# Patient Record
Sex: Male | Born: 1955
Health system: Southern US, Community
[De-identification: ages and names within clinical notes are randomized; demographics above are authoritative.]

## PROBLEM LIST (undated history)

## (undated) DIAGNOSIS — K219 Gastro-esophageal reflux disease without esophagitis: Secondary | ICD-10-CM

## (undated) DIAGNOSIS — B019 Varicella without complication: Secondary | ICD-10-CM

## (undated) DIAGNOSIS — I10 Essential (primary) hypertension: Secondary | ICD-10-CM

## (undated) DIAGNOSIS — Z803 Family history of malignant neoplasm of breast: Secondary | ICD-10-CM

## (undated) HISTORY — DX: Family history of malignant neoplasm of breast: Z80.3

## (undated) HISTORY — DX: Essential (primary) hypertension: I10

## (undated) HISTORY — PX: TONSILLECTOMY: SUR1361

## (undated) HISTORY — DX: Varicella without complication: B01.9

## (undated) HISTORY — DX: Gastro-esophageal reflux disease without esophagitis: K21.9

---
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
## 1978-03-01 HISTORY — PX: MANDIBLE SURGERY: SHX707

## 1978-03-01 HISTORY — PX: REPAIR KNEE LIGAMENT: SUR1188

## 2005-09-14 ENCOUNTER — Ambulatory Visit: Payer: Self-pay | Admitting: Family Medicine

## 2005-09-15 ENCOUNTER — Ambulatory Visit: Payer: Self-pay | Admitting: Family Medicine

## 2005-09-22 ENCOUNTER — Ambulatory Visit: Payer: Self-pay

## 2006-10-27 ENCOUNTER — Encounter: Payer: Self-pay | Admitting: Family Medicine

## 2008-12-30 ENCOUNTER — Encounter (INDEPENDENT_AMBULATORY_CARE_PROVIDER_SITE_OTHER): Payer: Self-pay | Admitting: *Deleted

## 2011-04-06 ENCOUNTER — Ambulatory Visit (INDEPENDENT_AMBULATORY_CARE_PROVIDER_SITE_OTHER): Payer: BC Managed Care – PPO | Admitting: Family Medicine

## 2011-04-06 ENCOUNTER — Encounter: Payer: Self-pay | Admitting: Family Medicine

## 2011-04-06 VITALS — BP 124/82 | HR 72 | Temp 98.2°F | Resp 12 | Ht 69.0 in | Wt 218.0 lb

## 2011-04-06 DIAGNOSIS — Z0000 Encounter for general adult medical examination without abnormal findings: Secondary | ICD-10-CM

## 2011-04-06 LAB — POCT URINALYSIS DIPSTICK
Bilirubin, UA: NEGATIVE
Leukocytes, UA: NEGATIVE
Nitrite, UA: NEGATIVE
Protein, UA: NEGATIVE
pH, UA: 6

## 2011-04-06 LAB — LIPID PANEL
Cholesterol: 156 mg/dL (ref 0–200)
HDL: 30.4 mg/dL — ABNORMAL LOW (ref >39.00)
Triglycerides: 170 mg/dL — ABNORMAL HIGH (ref 0.0–149.0)
VLDL: 34 mg/dL (ref 0.0–40.0)

## 2011-04-06 LAB — HEPATIC FUNCTION PANEL
ALT: 28 U/L (ref 0–53)
AST: 30 U/L (ref 0–37)
Albumin: 4.3 g/dL (ref 3.5–5.2)

## 2011-04-06 LAB — CBC WITH DIFFERENTIAL/PLATELET
Eosinophils Absolute: 0.1 10*3/uL (ref 0.0–0.7)
Eosinophils Relative: 1.4 % (ref 0.0–5.0)
HCT: 48.4 % (ref 39.0–52.0)
Lymphs Abs: 2.6 10*3/uL (ref 0.7–4.0)
MCHC: 34 g/dL (ref 30.0–36.0)
MCV: 95.4 fl (ref 78.0–100.0)
Monocytes Absolute: 0.7 10*3/uL (ref 0.1–1.0)
Platelets: 312 10*3/uL (ref 150.0–400.0)
WBC: 7.7 10*3/uL (ref 4.5–10.5)

## 2011-04-06 LAB — TSH: TSH: 2.49 u[IU]/mL (ref 0.35–5.50)

## 2011-04-06 LAB — BASIC METABOLIC PANEL
Calcium: 9.8 mg/dL (ref 8.4–10.5)
GFR: 81.23 mL/min (ref >60.00)
Potassium: 4.5 mEq/L (ref 3.5–5.1)
Sodium: 142 mEq/L (ref 135–145)

## 2011-04-06 LAB — PSA: PSA: 0.73 ng/mL (ref 0.10–4.00)

## 2011-04-06 NOTE — Progress Notes (Signed)
Subjective:    Patient ID: Miguel [name], male    DOB: 1956/01/09, 56 y.o.   MRN: [id number]  HPI  New patient to establish care and for complete physical examination. Past medical history reviewed. Mild GERD symptoms controlled with over-the-counter medications. No prescription medications. Surgical history as outlined elsewhere.  Family history significant for mother breast cancer. Father had high blood pressure elevated and cholesterol. No premature CAD. Sister with type 2 diabetes.  Patient is married with 2 teenage children. No history of smoking. No alcohol use. 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  No history of screening colonoscopy. What sounds like a flexible sigmoidoscopy several years ago. Last tetanus 2006. No recent lab work  Recent problems with right shoulder pain. Daily pain. Worse at night. No weakness. No injury. Radiation toward deltoid. No neck pain. Worse with abduction and internal patient  Past Medical History  Diagnosis Date  . GERD (gastroesophageal reflux disease)   . Chicken pox    Past Surgical History  Procedure Date  . Tonsillectomy   . Nose repair 1970    deviated septum  . Repair knee ligament 1980     accident  . Mandible surgery 1980  . Repair ankle ligament 1975    reports that he has never smoked. He does not have any smokeless tobacco history on file. His alcohol and drug histories not on file. family history includes Cancer in his mother; Diabetes in his sister; Hyperlipidemia in his father and mother; and Hypertension in his father and mother. No Known Allergies      Review of Systems  Constitutional: Negative for fever, activity change, appetite change and fatigue.  HENT: Negative for ear pain, congestion and trouble swallowing.   Eyes: Negative for pain and visual disturbance.  Respiratory: Negative for cough, shortness of breath and wheezing.   Cardiovascular: Negative for chest pain and  palpitations.  Gastrointestinal: Negative for nausea, vomiting, abdominal pain, diarrhea, constipation, blood in stool, abdominal distention and rectal pain.  Genitourinary: Negative for dysuria, hematuria and testicular pain.  Musculoskeletal: Positive for arthralgias (right shoulder). Negative for joint swelling.  Skin: Negative for rash.  Neurological: Negative for dizziness, syncope and headaches.  Hematological: Negative for adenopathy.  Psychiatric/Behavioral: Negative for confusion and dysphoric mood.       Objective:   Physical Exam  Constitutional: He is oriented to person, place, and time. He appears well-developed and well-nourished. No distress.  HENT:  Head: Normocephalic and atraumatic.  Right Ear: External ear normal.  Left Ear: External ear normal.  Mouth/Throat: Oropharynx is clear and moist.  Eyes: Conjunctivae and EOM are normal. Pupils are equal, round, and reactive to light.  Neck: Normal range of motion. Neck supple. No thyromegaly present.  Cardiovascular: Normal rate, regular rhythm and normal heart sounds.   No murmur heard. Pulmonary/Chest: No respiratory distress. He has no wheezes. He has no rales.  Abdominal: Soft. Bowel sounds are normal. He exhibits no distension and no mass. There is no tenderness. There is no rebound and no guarding.  Genitourinary: Rectum normal and prostate normal.  Musculoskeletal: He exhibits no edema.       Full range of motion right shoulder. No a.c. joint tenderness. Pain with abduction and internal rotation. No definite weakness.  Lymphadenopathy:    He has no cervical adenopathy.  Neurological: He is alert and oriented to person, place, and time. He displays normal reflexes. No cranial nerve deficit.  Skin: No rash noted.  Psychiatric: He has a  normal mood and affect.          Assessment & Plan:  Complete physical. Patient declines flu vaccine. Schedule screening colonoscopy. Schedule screening lab work. Needs to work  on weight loss and establishing regular exercise. Right shoulder pain.  Suspect rotator cuff tendonitis.  Discussed possible steroid injection and he prefers to observe for now.  ROM to avoid adhesive capsulitis.

## 2011-04-06 NOTE — Patient Instructions (Signed)
Work on establishing regular exercise and weight loss. We will call you regarding appointment for colonoscopy.

## 2011-04-07 NOTE — Progress Notes (Signed)
Quick Note:  Pt informed, labs mailed to home ______

## 2011-05-03 ENCOUNTER — Encounter: Payer: Self-pay | Admitting: Gastroenterology

## 2011-05-27 ENCOUNTER — Ambulatory Visit (AMBULATORY_SURGERY_CENTER): Payer: BC Managed Care – PPO | Admitting: *Deleted

## 2011-05-27 VITALS — Ht 69.0 in | Wt 212.5 lb

## 2011-05-27 DIAGNOSIS — Z1211 Encounter for screening for malignant neoplasm of colon: Secondary | ICD-10-CM

## 2011-05-27 MED ORDER — PEG-KCL-NACL-NASULF-NA ASC-C 100 G PO SOLR: ORAL | Status: DC

## 2011-06-09 ENCOUNTER — Encounter: Payer: Self-pay | Admitting: Gastroenterology

## 2011-06-09 ENCOUNTER — Ambulatory Visit (AMBULATORY_SURGERY_CENTER): Payer: BC Managed Care – PPO | Admitting: Gastroenterology

## 2011-06-09 VITALS — BP 151/100 | HR 81 | Temp 98.4°F | Resp 24 | Ht 69.0 in | Wt 212.0 lb

## 2011-06-09 DIAGNOSIS — Z1211 Encounter for screening for malignant neoplasm of colon: Secondary | ICD-10-CM

## 2011-06-09 DIAGNOSIS — K5730 Diverticulosis of large intestine without perforation or abscess without bleeding: Secondary | ICD-10-CM

## 2011-06-09 MED ORDER — SODIUM CHLORIDE 0.9 % IV SOLN: 500.0000 mL | INTRAVENOUS | Status: DC

## 2011-06-09 NOTE — Patient Instructions (Signed)
YOU HADF AN ENDOSCOPIC PROCEDURE TODAY AT THE [facility]: Refer to the procedure report that was given to you for any specific questions about what was found during the examination.  If the procedure report does not answer your questions, please call your gastroenterologist to clarify.  If you requested that your care partner not be given the details of your procedure findings, then the procedure report has been included in a sealed envelope for you to review at your convenience later.  YOU SHOULD EXPECT: Some feelings of bloating in the abdomen. Passage of more gas than usual.  Walking can help get rid of the air that was put into your GI tract during the procedure and reduce the bloating. If you had a lower endoscopy (such as a colonoscopy or flexible sigmoidoscopy) you may notice spotting of blood in your stool or on the toilet paper. If you underwent a bowel prep for your procedure, then you may not have a normal bowel movement for a few days.  DIET: Your first meal following the procedure should be a light meal and then it is ok to progress to your normal diet.  A half-sandwich or bowl of soup is an example of a good first meal.  Heavy or fried foods are harder to digest and may make you feel nauseous or bloated.  Likewise meals heavy in dairy and vegetables can cause extra gas to form and this can also increase the bloating.  Drink plenty of fluids but you should avoid alcoholic beverages for 24 hours.  ACTIVITY: Your care partner should take you home directly after the procedure.  You should plan to take it easy, moving slowly for the rest of the day.  You can resume normal activity the day after the procedure however you should NOT DRIVE or use heavy machinery for 24 hours (because of the sedation medicines used during the test).    SYMPTOMS TO REPORT IMMEDIATELY: A gastroenterologist can be reached at any hour.  During normal business hours, 8:30 AM to 5:00 PM Monday through Friday,  call [phone].  After hours and on weekends, please call the GI answering service at [phone] who will take a message and have the physician on call contact you.   Following lower endoscopy (colonoscopy or flexible sigmoidoscopy):  Excessive amounts of blood in the stool  Significant tenderness or worsening of abdominal pains  Swelling of the abdomen that is new, acute  Fever of 100F or higher    FOLLOW UP: If any biopsies were taken you will be contacted by phone or by letter within the next 1-3 weeks.  Call your gastroenterologist if you have not heard about the biopsies in 3 weeks.  Our staff will call the home number listed on your records the next business day following your procedure to check on you and address any questions or concerns that you may have at that time regarding the information given to you following your procedure. This is a courtesy call and so if there is no answer at the home number and we have not heard from you through the emergency physician on call, we will assume that you have returned to your regular daily activities without incident.  SIGNATURES/CONFIDENTIALITY: You and/or your care partner have signed paperwork which will be entered into your electronic medical record.  These signatures attest to the fact that that the information above on your After Visit Summary has been reviewed and is understood.  Full responsibility of the confidentiality  of this discharge information lies with you and/or your care-partner.     Information on diverticulosis & high fiber diet given to you today.

## 2011-06-09 NOTE — Op Note (Signed)
[facility] [address] [place], [place]  [address]  COLONOSCOPY PROCEDURE REPORT  PATIENT:  [name], Miguel  MR#:  [id number] BIRTHDATE:  12-15-1955, 56 yrs. old  GENDER:  male ENDOSCOPIST:  Vania Rea. Jarold Motto, MD, [facility] REF. BY:  Evelena Peat, M.D. PROCEDURE DATE:  06/09/2011 PROCEDURE:  Average-risk screening colonoscopy G0121 ASA CLASS:  Class I INDICATIONS:  Routine Risk Screening MEDICATIONS:   propofol (Diprivan) 150 mg IV  DESCRIPTION OF PROCEDURE:   After the risks and benefits and of the procedure were explained, informed consent was obtained. Digital rectal exam was performed and revealed no abnormalities. The LB 180AL [id number] endoscope was introduced through the anus and advanced to the cecum, which was identified by both the appendix and ileocecal valve.  The quality of the prep was excellent, using MoviPrep.  The instrument was then slowly withdrawn as the colon was fully examined. <<PROCEDUREIMAGES>>  FINDINGS:  Mild diverticulosis was found in the right colon.  No polyps or cancers were seen.  This was otherwise a normal examination of the colon.   Retroflexed views in the rectum revealed no abnormalities.    The scope was then withdrawn from the patient and the procedure completed.  COMPLICATIONS:  None ENDOSCOPIC IMPRESSION: 1) Mild diverticulosis in the right colon 2) No polyps or cancers 3) Otherwise normal examination RECOMMENDATIONS: 1) Continue current colorectal screening recommendations for "routine risk" patients with a repeat colonoscopy in 10 years.  REPEAT EXAM:  No  ______________________________ Vania Rea. Jarold Motto, MD, [facility]  CC:  n. eSIGNED:   Vania Rea. Dalaya Suppa at 06/09/2011 08:48 AM  Orlene Plum, [id number]

## 2011-06-09 NOTE — Progress Notes (Signed)
Patient did not experience any of the following events: a burn prior to discharge; a fall within the facility; wrong site/side/patient/procedure/implant event; or a hospital transfer or hospital admission upon discharge from the facility. (G8907) Patient did not have preoperative order for IV antibiotic SSI prophylaxis. (G8918)  

## 2011-06-09 NOTE — Progress Notes (Signed)
The pt tolerated the colonoscopy very well. Maw   

## 2011-06-10 ENCOUNTER — Telehealth: Payer: Self-pay

## 2011-06-10 NOTE — Telephone Encounter (Signed)
  Follow up Call-  Call back number 06/09/2011  Post procedure Call Back phone  # [phone]  Permission to leave phone message Yes     Patient questions:  Do you have a fever, pain , or abdominal swelling? no Pain Score  0 *  Have you tolerated food without any problems? yes  Have you been able to return to your normal activities? yes  Do you have any questions about your discharge instructions: Diet   no Medications  no Follow up visit  no  Do you have questions or concerns about your Care? no  Actions: * If pain score is 4 or above: No action needed, pain <4.  Per the pt "I did great". Maw

## 2012-04-07 ENCOUNTER — Other Ambulatory Visit (INDEPENDENT_AMBULATORY_CARE_PROVIDER_SITE_OTHER): Payer: BC Managed Care – PPO

## 2012-04-07 DIAGNOSIS — Z0000 Encounter for general adult medical examination without abnormal findings: Secondary | ICD-10-CM

## 2012-04-07 LAB — BASIC METABOLIC PANEL
BUN: 11 mg/dL (ref 6–23)
Chloride: 104 mEq/L (ref 96–112)
Potassium: 4.5 mEq/L (ref 3.5–5.1)

## 2012-04-07 LAB — POCT URINALYSIS DIPSTICK
Spec Grav, UA: 1.025
Urobilinogen, UA: 0.2
pH, UA: 6

## 2012-04-07 LAB — CBC WITH DIFFERENTIAL/PLATELET
Basophils Absolute: 0.1 10*3/uL (ref 0.0–0.1)
Basophils Relative: 1.4 % (ref 0.0–3.0)
Eosinophils Absolute: 0.1 10*3/uL (ref 0.0–0.7)
Lymphocytes Relative: 35.7 % (ref 12.0–46.0)
MCHC: 33.8 g/dL (ref 30.0–36.0)
Neutrophils Relative %: 50.7 % (ref 43.0–77.0)
RBC: 5 Mil/uL (ref 4.22–5.81)

## 2012-04-07 LAB — LIPID PANEL
Cholesterol: 146 mg/dL (ref 0–200)
LDL Cholesterol: 91 mg/dL (ref 0–99)
Triglycerides: 111 mg/dL (ref 0.0–149.0)
VLDL: 22.2 mg/dL (ref 0.0–40.0)

## 2012-04-07 LAB — HEPATIC FUNCTION PANEL
AST: 26 U/L (ref 0–37)
Albumin: 4.2 g/dL (ref 3.5–5.2)
Alkaline Phosphatase: 74 U/L (ref 39–117)
Total Protein: 7.2 g/dL (ref 6.0–8.3)

## 2012-04-14 ENCOUNTER — Encounter: Payer: BC Managed Care – PPO | Admitting: Family Medicine

## 2012-04-26 ENCOUNTER — Ambulatory Visit (INDEPENDENT_AMBULATORY_CARE_PROVIDER_SITE_OTHER): Payer: BC Managed Care – PPO | Admitting: Family Medicine

## 2012-04-26 ENCOUNTER — Encounter: Payer: Self-pay | Admitting: Family Medicine

## 2012-04-26 VITALS — BP 138/78 | HR 72 | Temp 98.9°F | Resp 12 | Ht 69.0 in | Wt 206.0 lb

## 2012-04-26 DIAGNOSIS — R319 Hematuria, unspecified: Secondary | ICD-10-CM

## 2012-04-26 DIAGNOSIS — Z0000 Encounter for general adult medical examination without abnormal findings: Secondary | ICD-10-CM

## 2012-04-26 LAB — URINALYSIS, ROUTINE W REFLEX MICROSCOPIC
Ketones, ur: NEGATIVE
Leukocytes, UA: NEGATIVE
Nitrite: NEGATIVE
Specific Gravity, Urine: >=1.03 (ref 1.000–1.030)
Urobilinogen, UA: 0.2 (ref 0.0–1.0)

## 2012-04-26 LAB — POCT URINALYSIS DIPSTICK
Bilirubin, UA: NEGATIVE
Glucose, UA: NEGATIVE
Leukocytes, UA: NEGATIVE
Nitrite, UA: NEGATIVE
pH, UA: 6

## 2012-04-26 NOTE — Patient Instructions (Signed)
Try to establish more consistent exercise habits Continue weight control/loss efforts

## 2012-04-26 NOTE — Progress Notes (Signed)
  Subjective:    Patient ID: Miguel [name], male    DOB: 01-29-1956, 57 y.o.   MRN: [id number]  HPI Patient here for complete physical. He has no real chronic medical problems. He has lost about 12 pounds during the past year due to his efforts. He has done this through dietary change and also has job on his feet about 30 hours per week, so has more activity. Generally feels well. Tetanus up-to-date. Colonoscopy last spring normal other than mild diverticulosis. Nonsmoker  Past Medical History  Diagnosis Date  . GERD (gastroesophageal reflux disease)   . Chicken pox    Past Surgical History  Procedure Laterality Date  . Tonsillectomy    . Nose repair  1970    deviated septum  . Repair knee ligament  1980     accident  . Mandible surgery  1980  . Repair ankle ligament  1975    reports that he has never smoked. He has never used smokeless tobacco. He reports that he does not drink alcohol or use illicit drugs. family history includes Cancer in his mother; Diabetes in his sister; Heart disease in his sister; Hyperlipidemia in his father, mother, and sister; and Hypertension in his father and mother.  There is no history of Colon cancer and Stomach cancer. No Known Allergies    Review of Systems  Constitutional: Negative for fever, activity change, appetite change and fatigue.  HENT: Negative for ear pain, congestion and trouble swallowing.   Eyes: Negative for pain and visual disturbance.  Respiratory: Negative for cough, shortness of breath and wheezing.   Cardiovascular: Negative for chest pain and palpitations.  Gastrointestinal: Negative for nausea, vomiting, abdominal pain, diarrhea, constipation, blood in stool, abdominal distention and rectal pain.  Genitourinary: Negative for dysuria, hematuria and testicular pain.  Musculoskeletal: Negative for joint swelling and arthralgias.  Skin: Negative for rash.  Neurological: Negative for dizziness, syncope and headaches.   Hematological: Negative for adenopathy.  Psychiatric/Behavioral: Negative for confusion and dysphoric mood.       Objective:   Physical Exam  Constitutional: He is oriented to person, place, and time. He appears well-developed and well-nourished. No distress.  HENT:  Head: Normocephalic and atraumatic.  Right Ear: External ear normal.  Left Ear: External ear normal.  Mouth/Throat: Oropharynx is clear and moist.  Eyes: Conjunctivae and EOM are normal. Pupils are equal, round, and reactive to light.  Neck: Normal range of motion. Neck supple. No thyromegaly present.  Cardiovascular: Normal rate, regular rhythm and normal heart sounds.   No murmur heard. Pulmonary/Chest: No respiratory distress. He has no wheezes. He has no rales.  Abdominal: Soft. Bowel sounds are normal. He exhibits no distension and no mass. There is no tenderness. There is no rebound and no guarding.  Musculoskeletal: He exhibits no edema.  Lymphadenopathy:    He has no cervical adenopathy.  Neurological: He is alert and oriented to person, place, and time. He displays normal reflexes. No cranial nerve deficit.  Skin: No rash noted.  Psychiatric: He has a normal mood and affect.          Assessment & Plan:  Healthy 57 year old male. Lipids are slightly improved with weight loss efforts. He has trace blood on urine dipstick. Repeat urine today and if blood persists urine micro-. Encouraged more regular exercise habits

## 2012-04-27 NOTE — Progress Notes (Signed)
Quick Note:  Pt informed ______ 

## 2013-10-10 ENCOUNTER — Ambulatory Visit (INDEPENDENT_AMBULATORY_CARE_PROVIDER_SITE_OTHER): Payer: BC Managed Care – PPO | Admitting: Family Medicine

## 2013-10-10 ENCOUNTER — Encounter: Payer: Self-pay | Admitting: Family Medicine

## 2013-10-10 VITALS — BP 140/86 | HR 79 | Wt 205.0 lb

## 2013-10-10 DIAGNOSIS — S39012A Strain of muscle, fascia and tendon of lower back, initial encounter: Secondary | ICD-10-CM

## 2013-10-10 DIAGNOSIS — S335XXA Sprain of ligaments of lumbar spine, initial encounter: Secondary | ICD-10-CM

## 2013-10-10 NOTE — Progress Notes (Signed)
Pre visit review using our clinic review tool, if applicable. No additional management support is needed unless otherwise documented below in the visit note. 

## 2013-10-10 NOTE — Patient Instructions (Signed)

## 2013-10-10 NOTE — Progress Notes (Signed)
   Subjective:    Patient ID: Miguel [name], male    DOB: Mar 13, 1955, 58 y.o.   MRN: [id number]  Back Pain Pertinent negatives include no abdominal pain, chest pain, dysuria, fever, numbness or weakness.   Acute visit. Low back pain. Onset couple days ago after picking up some boxes and twisting. Location is mid lumbar area. No radiculopathy symptoms. No numbness or weakness. No loss of bladder or bowel control. He took some diclofenac along with Aleve which seemed to help. Also took some leftover Flexeril. He tried icing which helps. Pain is 8/10 prior to medication and after taking Aleve pain had almost resolved.  Past Medical History  Diagnosis Date  . GERD (gastroesophageal reflux disease)   . Chicken pox    Past Surgical History  Procedure Laterality Date  . Tonsillectomy    . Nose repair  1970    deviated septum  . Repair knee ligament  1980     accident  . Mandible surgery  1980  . Repair ankle ligament  1975    reports that he has never smoked. He has never used smokeless tobacco. He reports that he does not drink alcohol or use illicit drugs. family history includes Cancer in his mother; Diabetes in his sister; Heart disease in his sister; Hyperlipidemia in his father, mother, and sister; Hypertension in his father and mother. There is no history of Colon cancer or Stomach cancer. No Known Allergies    Review of Systems  Constitutional: Negative for fever, activity change and appetite change.  Respiratory: Negative for cough and shortness of breath.   Cardiovascular: Negative for chest pain and leg swelling.  Gastrointestinal: Negative for vomiting and abdominal pain.  Genitourinary: Negative for dysuria, hematuria and flank pain.  Musculoskeletal: Positive for back pain. Negative for joint swelling.  Neurological: Negative for weakness and numbness.       Objective:   Physical Exam  Constitutional: He appears well-developed and well-nourished. No distress.    Cardiovascular: Normal rate and regular rhythm.   Pulmonary/Chest: Effort normal and breath sounds normal. No respiratory distress. He has no wheezes. He has no rales.  Musculoskeletal:  Right leg raise are negative  Neurological:  No focal weakness. Deep tendon reflexes are symmetric knee and ankle bilaterally.          Assessment & Plan:  Lumbosacral back strain. Nonfocal exam. Continue Aleve but avoid concomitant use of diclofenac. Continue Flexeril as needed. Continue icing or heat for symptomatic relief. Stretches reviewed. Consider physical therapy if no further improvement in 2 weeks

## 2014-07-31 ENCOUNTER — Encounter: Payer: Self-pay | Admitting: Family Medicine

## 2014-07-31 ENCOUNTER — Ambulatory Visit (INDEPENDENT_AMBULATORY_CARE_PROVIDER_SITE_OTHER): Payer: BLUE CROSS/BLUE SHIELD | Admitting: Family Medicine

## 2014-07-31 VITALS — BP 154/90 | HR 84 | Temp 98.5°F | Wt 201.0 lb

## 2014-07-31 DIAGNOSIS — R030 Elevated blood-pressure reading, without diagnosis of hypertension: Secondary | ICD-10-CM | POA: Diagnosis not present

## 2014-07-31 DIAGNOSIS — R222 Localized swelling, mass and lump, trunk: Secondary | ICD-10-CM

## 2014-07-31 DIAGNOSIS — R1900 Intra-abdominal and pelvic swelling, mass and lump, unspecified site: Secondary | ICD-10-CM | POA: Diagnosis not present

## 2014-07-31 DIAGNOSIS — IMO0001 Reserved for inherently not codable concepts without codable children: Secondary | ICD-10-CM

## 2014-07-31 NOTE — Progress Notes (Signed)
Pre visit review using our clinic review tool, if applicable. No additional management support is needed unless otherwise documented below in the visit note. 

## 2014-07-31 NOTE — Patient Instructions (Signed)
DASH Eating Plan °DASH stands for "Dietary Approaches to Stop Hypertension." The DASH eating plan is a healthy eating plan that has been shown to reduce high blood pressure (hypertension). Additional health benefits may include reducing the risk of type 2 diabetes mellitus, heart disease, and stroke. The DASH eating plan may also help with weight loss. °WHAT DO I NEED TO KNOW ABOUT THE DASH EATING PLAN? °For the DASH eating plan, you will follow these general guidelines: °· Choose foods with a percent daily value for sodium of less than 5% (as listed on the food label). °· Use salt-free seasonings or herbs instead of table salt or sea salt. °· Check with your health care provider or pharmacist before using salt substitutes. °· Eat lower-sodium products, often labeled as "lower sodium" or "no salt added." °· Eat fresh foods. °· Eat more vegetables, fruits, and low-fat dairy products. °· Choose whole grains. Look for the word "whole" as the first word in the ingredient list. °· Choose fish and skinless chicken or turkey more often than red meat. Limit fish, poultry, and meat to 6 oz (170 g) each day. °· Limit sweets, desserts, sugars, and sugary drinks. °· Choose heart-healthy fats. °· Limit cheese to 1 oz (28 g) per day. °· Eat more home-cooked food and less restaurant, buffet, and fast food. °· Limit fried foods. °· Cook foods using methods other than frying. °· Limit canned vegetables. If you do use them, rinse them well to decrease the sodium. °· When eating at a restaurant, ask that your food be prepared with less salt, or no salt if possible. °WHAT FOODS CAN I EAT? °Seek help from a dietitian for individual calorie needs. °Grains °Whole grain or whole wheat bread. Brown rice. Whole grain or whole wheat pasta. Quinoa, bulgur, and whole grain cereals. Low-sodium cereals. Corn or whole wheat flour tortillas. Whole grain cornbread. Whole grain crackers. Low-sodium crackers. °Vegetables °Fresh or frozen vegetables  (raw, steamed, roasted, or grilled). Low-sodium or reduced-sodium tomato and vegetable juices. Low-sodium or reduced-sodium tomato sauce and paste. Low-sodium or reduced-sodium canned vegetables.  °Fruits °All fresh, canned (in natural juice), or frozen fruits. °Meat and Other Protein Products °Ground beef (85% or leaner), grass-fed beef, or beef trimmed of fat. Skinless chicken or turkey. Ground chicken or turkey. Pork trimmed of fat. All fish and seafood. Eggs. Dried beans, peas, or lentils. Unsalted nuts and seeds. Unsalted canned beans. °Dairy °Low-fat dairy products, such as skim or 1% milk, 2% or reduced-fat cheeses, low-fat ricotta or cottage cheese, or plain low-fat yogurt. Low-sodium or reduced-sodium cheeses. °Fats and Oils °Tub margarines without trans fats. Light or reduced-fat mayonnaise and salad dressings (reduced sodium). Avocado. Safflower, olive, or canola oils. Natural peanut or almond butter. °Other °Unsalted popcorn and pretzels. °The items listed above may not be a complete list of recommended foods or beverages. Contact your dietitian for more options. °WHAT FOODS ARE NOT RECOMMENDED? °Grains °White bread. White pasta. White rice. Refined cornbread. Bagels and croissants. Crackers that contain trans fat. °Vegetables °Creamed or fried vegetables. Vegetables in a cheese sauce. Regular canned vegetables. Regular canned tomato sauce and paste. Regular tomato and vegetable juices. °Fruits °Dried fruits. Canned fruit in light or heavy syrup. Fruit juice. °Meat and Other Protein Products °Fatty cuts of meat. Ribs, chicken wings, bacon, sausage, bologna, salami, chitterlings, fatback, hot dogs, bratwurst, and packaged luncheon meats. Salted nuts and seeds. Canned beans with salt. °Dairy °Whole or 2% milk, cream, half-and-half, and cream cheese. Whole-fat or sweetened yogurt. Full-fat   cheeses or blue cheese. Nondairy creamers and whipped toppings. Processed cheese, cheese spreads, or cheese  curds. °Condiments °Onion and garlic salt, seasoned salt, table salt, and sea salt. Canned and packaged gravies. Worcestershire sauce. Tartar sauce. Barbecue sauce. Teriyaki sauce. Soy sauce, including reduced sodium. Steak sauce. Fish sauce. Oyster sauce. Cocktail sauce. Horseradish. Ketchup and mustard. Meat flavorings and tenderizers. Bouillon cubes. Hot sauce. Tabasco sauce. Marinades. Taco seasonings. Relishes. °Fats and Oils °Butter, stick margarine, lard, shortening, ghee, and bacon fat. Coconut, palm kernel, or palm oils. Regular salad dressings. °Other °Pickles and olives. Salted popcorn and pretzels. °The items listed above may not be a complete list of foods and beverages to avoid. Contact your dietitian for more information. °WHERE CAN I FIND MORE INFORMATION? °National Heart, [employer], and [employer] Institute: www.nhlbi.nih.gov/health/health-topics/topics/dash/ °Document Released: 02/04/2011 Document Revised: 07/02/2013 Document Reviewed: 12/20/2012 °Exit[employer]® Patient Information ©2015 [employer]. This information is not intended to replace advice given to you by your health care provider. Make sure you discuss any questions you have with your health care provider. ° °

## 2014-07-31 NOTE — Progress Notes (Signed)
   Subjective:    Patient ID: Miguel [name], male    DOB: Aug 22, 1955, 59 y.o.   MRN: [id number]  HPI Here to discuss recent elevated blood pressure. He was at dentist office and had pre-procedure blood pressure 190/110. He has never been diagnosed with hypertension but positive family history and father and sister. No headaches. No chest pains. No dizziness. Does eat a lot of salt. No regular alcohol. No NSAID use.  No consistent aerobic exercise.  Recent small fatty lump right anterior abdominal wall. Nontender. Not growing rapidly in size. No recent appetite or weight changes.  He has not noted any adenopathy or night sweats.  Past Medical History  Diagnosis Date  . GERD (gastroesophageal reflux disease)   . Chicken pox    Past Surgical History  Procedure Laterality Date  . Tonsillectomy    . Nose repair  1970    deviated septum  . Repair knee ligament  1980     accident  . Mandible surgery  1980  . Repair ankle ligament  1975    reports that he has never smoked. He has never used smokeless tobacco. He reports that he does not drink alcohol or use illicit drugs. family history includes Cancer in his mother; Diabetes in his sister; Heart disease in his sister; Hyperlipidemia in his father, mother, and sister; Hypertension in his father and mother. There is no history of Colon cancer or Stomach cancer. No Known Allergies    Review of Systems  Constitutional: Negative for appetite change, fatigue and unexpected weight change.  Eyes: Negative for visual disturbance.  Respiratory: Negative for cough, chest tightness and shortness of breath.   Cardiovascular: Negative for chest pain, palpitations and leg swelling.  Gastrointestinal: Negative for abdominal pain.  Musculoskeletal: Negative for back pain.  Skin: Negative for rash.  Neurological: Negative for dizziness, syncope, weakness, light-headedness and headaches.  Hematological: Negative for adenopathy. Does not bruise/bleed  easily.       Objective:   Physical Exam  Constitutional: He appears well-developed and well-nourished. No distress.  Eyes: Pupils are equal, round, and reactive to light.  Neck: Normal range of motion. Neck supple. No thyromegaly present.  Cardiovascular: Normal rate, regular rhythm and normal heart sounds.   No murmur heard. Pulmonary/Chest: No respiratory distress. He has no wheezes. He has no rales.  Abdominal: Soft. Bowel sounds are normal. He exhibits no distension and no mass. There is no tenderness. There is no rebound and no guarding.  Right abdominal wall just lateral to umbilicus approximately 2 cm fatty nontender mass which is slightly mobile  Musculoskeletal: He exhibits no edema.  Skin: No rash noted.  Psychiatric: He has a normal mood and affect.          Assessment & Plan:  # 1 elevated blood pressure. Repeat reading by me 154/90. We discussed lifestyle modification. DASH diet given. Start regular aerobic exercise. Get home blood pressure cuff. Reassess blood pressure here 8 weeks and if not improved at that point initiate medication #2 probable lipoma right abdominal wall. Reassurance. Follow-up for any pain or rapid growth or other changes

## 2014-10-02 ENCOUNTER — Ambulatory Visit: Payer: BLUE CROSS/BLUE SHIELD | Admitting: Family Medicine

## 2014-10-09 ENCOUNTER — Ambulatory Visit (INDEPENDENT_AMBULATORY_CARE_PROVIDER_SITE_OTHER): Payer: BLUE CROSS/BLUE SHIELD | Admitting: Family Medicine

## 2014-10-09 ENCOUNTER — Encounter: Payer: Self-pay | Admitting: Family Medicine

## 2014-10-09 VITALS — BP 130/90 | HR 80 | Temp 98.4°F | Wt 202.0 lb

## 2014-10-09 DIAGNOSIS — R030 Elevated blood-pressure reading, without diagnosis of hypertension: Secondary | ICD-10-CM | POA: Diagnosis not present

## 2014-10-09 DIAGNOSIS — IMO0001 Reserved for inherently not codable concepts without codable children: Secondary | ICD-10-CM

## 2014-10-09 NOTE — Progress Notes (Signed)
Pre visit review using our clinic review tool, if applicable. No additional management support is needed unless otherwise documented below in the visit note. 

## 2014-10-09 NOTE — Progress Notes (Signed)
   Subjective:    Patient ID: Miguel [name], male    DOB: Jul 18, 1955, 58 y.o.   MRN: [id number]  HPI  follow-up elevated blood pressure. Refer to note from June. He had blood pressure reading then of 154/90. He has reduced fried foods and high sodium foods since then and generally feels well. He never did get home blood pressure monitor. He takes no medications. No regular alcohol use. No chest pains or dizziness  Past Medical History  Diagnosis Date  . GERD (gastroesophageal reflux disease)   . Chicken pox    Past Surgical History  Procedure Laterality Date  . Tonsillectomy    . Nose repair  1970    deviated septum  . Repair knee ligament  1980     accident  . Mandible surgery  1980  . Repair ankle ligament  1975    reports that he has never smoked. He has never used smokeless tobacco. He reports that he does not drink alcohol or use illicit drugs. family history includes Cancer in his mother; Diabetes in his sister; Heart disease in his sister; Hyperlipidemia in his father, mother, and sister; Hypertension in his father and mother. There is no history of Colon cancer or Stomach cancer. No Known Allergies    Review of Systems  Constitutional: Negative for fatigue.  Eyes: Negative for visual disturbance.  Respiratory: Negative for cough, chest tightness and shortness of breath.   Cardiovascular: Negative for chest pain, palpitations and leg swelling.  Neurological: Negative for dizziness, syncope, weakness, light-headedness and headaches.       Objective:   Physical Exam  Constitutional: He appears well-developed and well-nourished.  Neck: Neck supple. No thyromegaly present.  Cardiovascular: Normal rate and regular rhythm.   Pulmonary/Chest: Effort normal and breath sounds normal. No respiratory distress. He has no wheezes. He has no rales.  Musculoskeletal: He exhibits no edema.          Assessment & Plan:   Elevated blood pressure. Repeat by me right arm  seated 142/90. We've recommend continued lifestyle management. Try to lose some weight. More consistent aerobic exercise. Continue low-sodium diet.

## 2014-10-09 NOTE — Patient Instructions (Signed)
Monitor blood pressure and be touch if consistently > 150/90

## 2015-04-11 ENCOUNTER — Encounter: Payer: Self-pay | Admitting: Family Medicine

## 2015-04-11 ENCOUNTER — Ambulatory Visit (INDEPENDENT_AMBULATORY_CARE_PROVIDER_SITE_OTHER): Payer: BLUE CROSS/BLUE SHIELD | Admitting: Family Medicine

## 2015-04-11 VITALS — BP 150/100 | HR 89 | Temp 98.7°F | Ht 69.0 in | Wt 203.0 lb

## 2015-04-11 DIAGNOSIS — I10 Essential (primary) hypertension: Secondary | ICD-10-CM

## 2015-04-11 MED ORDER — VALSARTAN 80 MG PO TABS: 80.0000 mg | ORAL_TABLET | Freq: Every day | ORAL | Status: DC

## 2015-04-11 NOTE — Progress Notes (Signed)
   Subjective:    Patient ID: Miguel [name], male    DOB: 10-01-1955, 60 y.o.   MRN: [id number]  HPI  Patient seen for follow-up elevated blood pressure. Acquired home cuff and consistent readings 155 to165 systolic and 90-95 diastolic. Never been treated for hypertension. No headaches. No dizziness. No chest pains. Inconsistent exercise. Watching diet closely with low sodium. No excessive alcohol use.  Review of Systems  Constitutional: Negative for fatigue.  Eyes: Negative for visual disturbance.  Respiratory: Negative for cough, chest tightness and shortness of breath.   Cardiovascular: Negative for chest pain, palpitations and leg swelling.  Neurological: Negative for dizziness, syncope, weakness, light-headedness and headaches.       Objective:   Physical Exam  Constitutional: He is oriented to person, place, and time. He appears well-developed and well-nourished.  HENT:  Right Ear: External ear normal.  Left Ear: External ear normal.  Mouth/Throat: Oropharynx is clear and moist.  Eyes: Pupils are equal, round, and reactive to light.  Neck: Neck supple. No thyromegaly present.  Cardiovascular: Normal rate and regular rhythm.   Pulmonary/Chest: Effort normal and breath sounds normal. No respiratory distress. He has no wheezes. He has no rales.  Musculoskeletal: He exhibits no edema.  Neurological: He is alert and oriented to person, place, and time.          Assessment & Plan:  Hypertension. Currently untreated. Start valsartan 80 mg one by mouth daily. Regular aerobic exercise encouraged. Low-sodium diet. Reassess at physical in one month

## 2015-04-11 NOTE — Progress Notes (Signed)
Pre visit review using our clinic review tool, if applicable. No additional management support is needed unless otherwise documented below in the visit note. 

## 2015-05-02 ENCOUNTER — Other Ambulatory Visit (INDEPENDENT_AMBULATORY_CARE_PROVIDER_SITE_OTHER): Payer: BLUE CROSS/BLUE SHIELD

## 2015-05-02 DIAGNOSIS — Z0000 Encounter for general adult medical examination without abnormal findings: Secondary | ICD-10-CM

## 2015-05-02 LAB — BASIC METABOLIC PANEL
BUN: 13 mg/dL (ref 6–23)
CALCIUM: 9.6 mg/dL (ref 8.4–10.5)
CO2: 30 mEq/L (ref 19–32)
Chloride: 102 mEq/L (ref 96–112)
Creatinine, Ser: 0.87 mg/dL (ref 0.40–1.50)
GFR: 95.12 mL/min (ref >60.00)
GLUCOSE: 76 mg/dL (ref 70–99)
Potassium: 4.2 mEq/L (ref 3.5–5.1)
SODIUM: 140 meq/L (ref 135–145)

## 2015-05-02 LAB — CBC WITH DIFFERENTIAL/PLATELET
BASOS ABS: 0.1 10*3/uL (ref 0.0–0.1)
Basophils Relative: 0.8 % (ref 0.0–3.0)
EOS ABS: 0.1 10*3/uL (ref 0.0–0.7)
Eosinophils Relative: 1.7 % (ref 0.0–5.0)
HEMATOCRIT: 44.5 % (ref 39.0–52.0)
Hemoglobin: 15.4 g/dL (ref 13.0–17.0)
LYMPHS PCT: 32.7 % (ref 12.0–46.0)
Lymphs Abs: 2.7 10*3/uL (ref 0.7–4.0)
MCHC: 34.5 g/dL (ref 30.0–36.0)
MCV: 91.8 fl (ref 78.0–100.0)
MONOS PCT: 10.3 % (ref 3.0–12.0)
Monocytes Absolute: 0.9 10*3/uL (ref 0.1–1.0)
NEUTROS PCT: 54.5 % (ref 43.0–77.0)
Neutro Abs: 4.6 10*3/uL (ref 1.4–7.7)
PLATELETS: 295 10*3/uL (ref 150.0–400.0)
RBC: 4.85 Mil/uL (ref 4.22–5.81)
RDW: 12.9 % (ref 11.5–15.5)
WBC: 8.4 10*3/uL (ref 4.0–10.5)

## 2015-05-02 LAB — HEPATIC FUNCTION PANEL
ALBUMIN: 4.5 g/dL (ref 3.5–5.2)
ALK PHOS: 59 U/L (ref 39–117)
ALT: 28 U/L (ref 0–53)
AST: 23 U/L (ref 0–37)
BILIRUBIN DIRECT: 0.1 mg/dL (ref 0.0–0.3)
TOTAL PROTEIN: 7.1 g/dL (ref 6.0–8.3)
Total Bilirubin: 0.8 mg/dL (ref 0.2–1.2)

## 2015-05-02 LAB — LIPID PANEL
CHOL/HDL RATIO: 4
Cholesterol: 169 mg/dL (ref 0–200)
HDL: 39 mg/dL — AB (ref >39.00)
LDL Cholesterol: 106 mg/dL — ABNORMAL HIGH (ref 0–99)
NONHDL: 129.79
Triglycerides: 119 mg/dL (ref 0.0–149.0)
VLDL: 23.8 mg/dL (ref 0.0–40.0)

## 2015-05-02 LAB — TSH: TSH: 1.67 u[IU]/mL (ref 0.35–4.50)

## 2015-05-02 LAB — PSA: PSA: 0.72 ng/mL (ref 0.10–4.00)

## 2015-05-07 ENCOUNTER — Encounter: Payer: Self-pay | Admitting: Family Medicine

## 2015-05-07 ENCOUNTER — Ambulatory Visit (INDEPENDENT_AMBULATORY_CARE_PROVIDER_SITE_OTHER): Payer: BLUE CROSS/BLUE SHIELD | Admitting: Family Medicine

## 2015-05-07 VITALS — BP 130/96 | HR 83 | Temp 98.4°F | Ht 69.0 in | Wt 204.9 lb

## 2015-05-07 DIAGNOSIS — Z8249 Family history of ischemic heart disease and other diseases of the circulatory system: Secondary | ICD-10-CM | POA: Diagnosis not present

## 2015-05-07 DIAGNOSIS — Z23 Encounter for immunization: Secondary | ICD-10-CM | POA: Diagnosis not present

## 2015-05-07 DIAGNOSIS — Z0000 Encounter for general adult medical examination without abnormal findings: Secondary | ICD-10-CM

## 2015-05-07 MED ORDER — VALSARTAN 160 MG PO TABS: 160.0000 mg | ORAL_TABLET | Freq: Every day | ORAL | Status: DC

## 2015-05-07 NOTE — Progress Notes (Signed)
Pre visit review using our clinic review tool, if applicable. No additional management support is needed unless otherwise documented below in the visit note. 

## 2015-05-07 NOTE — Patient Instructions (Signed)
Go ahead and increase Diovan to 160 mg daily Consider baby aspirin (81 mg) once daily

## 2015-05-07 NOTE — Progress Notes (Signed)
Subjective:    Patient ID: Miguel [name], male    DOB: Nov 18, 1955, 60 y.o.   MRN: [id number]  HPI  physical exam.  Hypertension recently diagnosed and started valsartan 80 mg daily. Recent blood pressures have still been running around 150/90. No headaches. No dizziness.  Takes no other medications. Colonoscopy up-to-date. Needs tetanus booster. Declines shingles vaccine.   Patient never smoked. No regular exercise. No regular alcohol use.  Father had history of premature CAD.  Patient has never had any exertional chest pains or exertional dyspnea.  Past Medical History  Diagnosis Date  . GERD (gastroesophageal reflux disease)   . Chicken pox    Past Surgical History  Procedure Laterality Date  . Tonsillectomy    . Nose repair  1970    deviated septum  . Repair knee ligament  1980     accident  . Mandible surgery  1980  . Repair ankle ligament  1975    reports that he has never smoked. He has never used smokeless tobacco. He reports that he does not drink alcohol or use illicit drugs. family history includes Cancer in his mother; Diabetes in his sister; Heart disease in his sister; Hyperlipidemia in his father, mother, and sister; Hypertension in his father and mother. There is no history of Colon cancer or Stomach cancer. No Known Allergies    Review of Systems  Constitutional: Negative for fever, activity change, appetite change and fatigue.  HENT: Negative for congestion, ear pain and trouble swallowing.   Eyes: Negative for pain and visual disturbance.  Respiratory: Negative for cough, shortness of breath and wheezing.   Cardiovascular: Negative for chest pain and palpitations.  Gastrointestinal: Negative for nausea, vomiting, abdominal pain, diarrhea, constipation, blood in stool, abdominal distention and rectal pain.  Genitourinary: Negative for dysuria, hematuria and testicular pain.  Musculoskeletal: Negative for joint swelling and arthralgias.  Skin: Negative  for rash.  Neurological: Negative for dizziness, syncope and headaches.  Hematological: Negative for adenopathy.  Psychiatric/Behavioral: Negative for confusion and dysphoric mood.       Objective:   Physical Exam  Constitutional: He is oriented to person, place, and time. He appears well-developed and well-nourished. No distress.  HENT:  Head: Normocephalic and atraumatic.  Right Ear: External ear normal.  Left Ear: External ear normal.  Mouth/Throat: Oropharynx is clear and moist.  Eyes: Conjunctivae and EOM are normal. Pupils are equal, round, and reactive to light.  Neck: Normal range of motion. Neck supple. No thyromegaly present.  Cardiovascular: Normal rate, regular rhythm and normal heart sounds.   No murmur heard. Pulmonary/Chest: No respiratory distress. He has no wheezes. He has no rales.  Abdominal: Soft. Bowel sounds are normal. He exhibits no distension and no mass. There is no tenderness. There is no rebound and no guarding.  Musculoskeletal: He exhibits no edema.  Lymphadenopathy:    He has no cervical adenopathy.  Neurological: He is alert and oriented to person, place, and time. He displays normal reflexes. No cranial nerve deficit.  Skin: No rash noted.  Psychiatric: He has a normal mood and affect.          Assessment & Plan:   Physical exam. Tetanus booster given. Baseline EKG obtained. Normal sinus rhythm with no acute findings. Labs reviewed. No major concerns. Shingles vaccine discussed and he declines. Suggest consider baby aspirin with other risk factors for CAD. We discussed pros and cons of stress testing and he declines at this time. Blood pressure still slightly up today.  Suggest increase Valsartan to160 mg once daily. Touch base if blood pressure not consistently less than 150/90 over the next few weeks.

## 2015-09-24 DIAGNOSIS — H52223 Regular astigmatism, bilateral: Secondary | ICD-10-CM | POA: Diagnosis not present

## 2015-09-24 DIAGNOSIS — H524 Presbyopia: Secondary | ICD-10-CM | POA: Diagnosis not present

## 2015-09-24 DIAGNOSIS — H5213 Myopia, bilateral: Secondary | ICD-10-CM | POA: Diagnosis not present

## 2016-05-14 ENCOUNTER — Other Ambulatory Visit: Payer: Self-pay | Admitting: Family Medicine

## 2016-06-14 ENCOUNTER — Encounter: Payer: Self-pay | Admitting: Family Medicine

## 2016-06-14 ENCOUNTER — Ambulatory Visit (INDEPENDENT_AMBULATORY_CARE_PROVIDER_SITE_OTHER): Payer: BLUE CROSS/BLUE SHIELD | Admitting: Family Medicine

## 2016-06-14 VITALS — BP 130/70 | HR 82 | Temp 98.4°F | Wt 223.8 lb

## 2016-06-14 DIAGNOSIS — K439 Ventral hernia without obstruction or gangrene: Secondary | ICD-10-CM

## 2016-06-14 DIAGNOSIS — I10 Essential (primary) hypertension: Secondary | ICD-10-CM

## 2016-06-14 MED ORDER — VALSARTAN 160 MG PO TABS: 160.0000 mg | ORAL_TABLET | Freq: Every day | ORAL | 3 refills | Status: DC

## 2016-06-14 NOTE — Patient Instructions (Signed)
Ventral Hernia A ventral hernia is a bulge of tissue from inside the abdomen that pushes through a weak area of the muscles that form the front wall of the abdomen. The tissues inside the abdomen are inside a sac (peritoneum). These tissues include the small intestine, large intestine, and the fatty tissue that covers the intestines (omentum). Sometimes, the bulge that forms a hernia contains intestines. Other hernias contain only fat. Ventral hernias do not go away without surgical treatment. There are several types of ventral hernias. You may have:  A hernia at an incision site from previous abdominal surgery (incisional hernia).  A hernia just above the belly button (epigastric hernia), or at the belly button (umbilical hernia). These types of hernias can develop from heavy lifting or straining.  A hernia that comes and goes (reducible hernia). It may be visible only when you lift or strain. This type of hernia can be pushed back into the abdomen (reduced).  A hernia that traps abdominal tissue inside the hernia (incarcerated hernia). This type of hernia does not reduce.  A hernia that cuts off blood flow to the tissues inside the hernia (strangulated hernia). The tissues can start to die if this happens. This is a very painful bulge that cannot be reduced. A strangulated hernia is a medical emergency. What are the causes? This condition is caused by abdominal tissue putting pressure on an area of weakness in the abdominal muscles. What increases the risk? The following factors may make you more likely to develop this condition:  Being male.  Being 60 or older.  Being overweight or obese.  Having had previous abdominal surgery, especially if there was an infection after surgery.  Having had an injury to the abdominal wall.  Having had several pregnancies.  Having a buildup of fluid inside the abdomen (ascites). What are the signs or symptoms? The only symptom of a ventral hernia  may be a painless bulge in the abdomen. A reducible hernia may be visible only when you strain, cough, or lift. Other symptoms may include:  Dull pain.  A feeling of pressure. Signs and symptoms of a strangulated hernia may include:  Increasing pain.  Nausea and vomiting.  Pain when pressing on the hernia.  The skin over the hernia turning red or purple.  Constipation.  Blood in the stool (feces). How is this diagnosed? This condition may be diagnosed based on:  Your symptoms.  Your medical history.  A physical exam. You may be asked to cough or strain while standing. These actions increase the pressure inside your abdomen and force the hernia through the opening in your muscles. Your health care provider may try to reduce the hernia by pressing on it.  Imaging studies, such as an ultrasound or CT scan. How is this treated? This condition is treated with surgery. If you have a strangulated hernia, surgery is done as soon as possible. If your hernia is small and not incarcerated, you may be asked to lose some weight before surgery. Follow these instructions at home:  Follow instructions from your health care provider about eating or drinking restrictions.  If you are overweight, your health care provider may recommend that you increase your activity level and eat a healthier diet.  Do not lift anything that is heavier than 10 lb (4.5 kg).  Return to your normal activities as told by your health care provider. Ask your health care provider what activities are safe for you. You may need to avoid activities that   increase pressure on your hernia.  Take over-the-counter and prescription medicines only as told by your health care provider.  Keep all follow-up visits as told by your health care provider. This is important. Contact a health care provider if:  Your hernia gets larger.  Your hernia becomes painful. Get help right away if:  Your hernia becomes increasingly  painful.  You have pain along with any of the following:  Changes in skin color in the area of the hernia.  Nausea.  Vomiting.  Fever. Summary  A ventral hernia is a bulge of tissue from inside the abdomen that pushes through a weak area of the muscles that form the front wall of the abdomen.  This condition is treated with surgery, which may be urgent depending on your hernia.  Do not lift anything that is heavier than 10 lb (4.5 kg), and follow activity instructions from your health care provider. This information is not intended to replace advice given to you by your health care provider. Make sure you discuss any questions you have with your health care provider. Document Released: 02/02/2012 Document Revised: 10/03/2015 Document Reviewed: 10/03/2015 Elsevier Interactive Patient Education  2017 [employer]  

## 2016-06-14 NOTE — Progress Notes (Signed)
Subjective:     Patient ID: Miguel [name], male   DOB: Apr 21, 1955, 61 y.o.   MRN: [id number]  HPI Patient here for follow-up hypertension. He takes valsartan and is compliant with therapy. His weight is up about 20 pounds from last year. He changed jobs in his current job is very sedentary. No consistent exercise. Denies any headaches, dizziness, or chest pains.  He has ventral hernia. Not painful. Unable to do core exercises because of this and starting limit activities. He would like to consider repair but would like to look at possibly waiting till the Fall to get this done.  Past Medical History:  Diagnosis Date  . Chicken pox   . GERD (gastroesophageal reflux disease)   . Hypertension    Past Surgical History:  Procedure Laterality Date  . MANDIBLE SURGERY  1980  . nose repair  1970   deviated septum  . REPAIR ANKLE LIGAMENT  1975  . REPAIR KNEE LIGAMENT  1980    accident  . TONSILLECTOMY      reports that he has never smoked. He has never used smokeless tobacco. He reports that he does not drink alcohol or use drugs. family history includes Cancer in his mother; Diabetes in his sister; Heart disease in his sister; Hyperlipidemia in his father, mother, and sister; Hypertension in his father and mother. No Known Allergies   Review of Systems  Constitutional: Negative for fatigue.  Eyes: Negative for visual disturbance.  Respiratory: Negative for cough, chest tightness and shortness of breath.   Cardiovascular: Negative for chest pain, palpitations and leg swelling.  Endocrine: Negative for polydipsia and polyuria.  Neurological: Negative for dizziness, syncope, weakness, light-headedness and headaches.       Objective:   Physical Exam  Constitutional: He is oriented to person, place, and time. He appears well-developed and well-nourished.  HENT:  Right Ear: External ear normal.  Left Ear: External ear normal.  Mouth/Throat: Oropharynx is clear and moist.  Eyes:  Pupils are equal, round, and reactive to light.  Neck: Neck supple. No thyromegaly present.  Cardiovascular: Normal rate and regular rhythm.   Pulmonary/Chest: Effort normal and breath sounds normal. No respiratory distress. He has no wheezes. He has no rales.  Abdominal:  Patient small ventral hernia which is soft and nontender.  Musculoskeletal: He exhibits no edema.  Neurological: He is alert and oriented to person, place, and time.       Assessment:     #1 hypertension stable and at goal  #2 small ventral hernia    Plan:     -Refill Diovan for one year -Encouraged weight loss and more consistent exercise -Discussed surgical referral for hernia and at this point he wishes to wait until end of summer to look at repair but does eventually plan to get this repaired within the next year  Kristian Covey MD [facility]

## 2016-06-14 NOTE — Progress Notes (Signed)
Pre visit review using our clinic review tool, if applicable. No additional management support is needed unless otherwise documented below in the visit note. 

## 2016-10-13 ENCOUNTER — Telehealth: Payer: Self-pay | Admitting: *Deleted

## 2016-10-13 NOTE — Telephone Encounter (Signed)
Left message on machine for patient to return our call to inform patient to stop valsartan and new medication will be sent in.

## 2016-10-13 NOTE — Telephone Encounter (Signed)
[facility] valsartan (DIOVAN) 160 MG tablet has been recalled.  Please change to a new prescription.

## 2016-10-13 NOTE — Telephone Encounter (Signed)
Change to losartan 100 mg once daily

## 2016-10-14 MED ORDER — LOSARTAN POTASSIUM 100 MG PO TABS: 100.0000 mg | ORAL_TABLET | Freq: Every day | ORAL | 0 refills | Status: DC

## 2016-10-14 NOTE — Telephone Encounter (Signed)
Pt calling to check the status of the Rx valsartan

## 2016-10-14 NOTE — Telephone Encounter (Signed)
I called the pt and informed him of the message below and the Rx was sent to his pharmacy. 

## 2016-11-08 DIAGNOSIS — H52223 Regular astigmatism, bilateral: Secondary | ICD-10-CM | POA: Diagnosis not present

## 2016-11-08 DIAGNOSIS — H5213 Myopia, bilateral: Secondary | ICD-10-CM | POA: Diagnosis not present

## 2016-11-08 DIAGNOSIS — H524 Presbyopia: Secondary | ICD-10-CM | POA: Diagnosis not present

## 2017-01-09 ENCOUNTER — Other Ambulatory Visit: Payer: Self-pay | Admitting: Family Medicine

## 2017-01-13 ENCOUNTER — Other Ambulatory Visit: Payer: Self-pay | Admitting: Family Medicine

## 2017-05-10 ENCOUNTER — Ambulatory Visit (INDEPENDENT_AMBULATORY_CARE_PROVIDER_SITE_OTHER): Payer: BLUE CROSS/BLUE SHIELD | Admitting: Family Medicine

## 2017-05-10 ENCOUNTER — Encounter: Payer: Self-pay | Admitting: Family Medicine

## 2017-05-10 VITALS — BP 128/78 | HR 91 | Temp 98.7°F | Wt 223.2 lb

## 2017-05-10 DIAGNOSIS — I10 Essential (primary) hypertension: Secondary | ICD-10-CM | POA: Diagnosis not present

## 2017-05-10 MED ORDER — LOSARTAN POTASSIUM 100 MG PO TABS: 100.0000 mg | ORAL_TABLET | Freq: Every day | ORAL | 3 refills | Status: DC

## 2017-05-10 NOTE — Patient Instructions (Signed)
Set up complete physical Try to lose some weight.

## 2017-05-10 NOTE — Progress Notes (Signed)
Subjective:     Patient ID: Miguel PlumJoseph [name], male   DOB: May 04, 1955, 62 y.o.   MRN: [id number]  HPI Patient here to follow-up hypertension. He takes losartan 100 mg daily. Not monitoring blood pressures at home. He has some weight gain this past year. No consistent exercise. He has history of GERD and takes over-the-counter omeprazole but every other day which seems to control things fairly well. No headaches. No dizziness. No chest pain. No regular alcohol use.  Past Medical History:  Diagnosis Date  . Chicken pox   . GERD (gastroesophageal reflux disease)   . Hypertension    Past Surgical History:  Procedure Laterality Date  . MANDIBLE SURGERY  1980  . nose repair  1970   deviated septum  . REPAIR ANKLE LIGAMENT  1975  . REPAIR KNEE LIGAMENT  1980    accident  . TONSILLECTOMY      reports that  has never smoked. he has never used smokeless tobacco. He reports that he does not drink alcohol or use drugs. family history includes Cancer in his mother; Diabetes in his sister; Heart disease in his sister; Hyperlipidemia in his father, mother, and sister; Hypertension in his father and mother. No Known Allergies   Review of Systems  Constitutional: Negative for fatigue.  Eyes: Negative for visual disturbance.  Respiratory: Negative for cough, chest tightness and shortness of breath.   Cardiovascular: Negative for chest pain, palpitations and leg swelling.  Endocrine: Negative for polydipsia and polyuria.  Neurological: Negative for dizziness, syncope, weakness, light-headedness and headaches.       Objective:   Physical Exam  Constitutional: He is oriented to person, place, and time. He appears well-developed and well-nourished.  HENT:  Right Ear: External ear normal.  Left Ear: External ear normal.  Mouth/Throat: Oropharynx is clear and moist.  Eyes: Pupils are equal, round, and reactive to light.  Neck: Neck supple. No thyromegaly present.  Cardiovascular: Normal rate and  regular rhythm.  Pulmonary/Chest: Effort normal and breath sounds normal. No respiratory distress. He has no wheezes. He has no rales.  Musculoskeletal: He exhibits no edema.  Neurological: He is alert and oriented to person, place, and time.       Assessment:     Hypertension. Borderline elevation today.    Plan:     -Encouraged him to lose some weight and establish more consistent exercise -Refill medication for one year -Set up complete physical with the next 3 months. If blood pressure not further improved at that time consider additional medication.  Kristian CoveyBruce W Gera Inboden MD [facility]

## 2017-11-29 ENCOUNTER — Encounter: Payer: Self-pay | Admitting: Family Medicine

## 2017-11-29 ENCOUNTER — Ambulatory Visit (INDEPENDENT_AMBULATORY_CARE_PROVIDER_SITE_OTHER): Payer: BLUE CROSS/BLUE SHIELD | Admitting: Family Medicine

## 2017-11-29 ENCOUNTER — Other Ambulatory Visit: Payer: Self-pay

## 2017-11-29 VITALS — BP 148/92 | HR 84 | Temp 98.3°F | Wt 219.5 lb

## 2017-11-29 DIAGNOSIS — M545 Low back pain, unspecified: Secondary | ICD-10-CM

## 2017-11-29 MED ORDER — HYDROCODONE-ACETAMINOPHEN 5-325 MG PO TABS: 1.0000 | ORAL_TABLET | Freq: Four times a day (QID) | ORAL | 0 refills | Status: DC | PRN

## 2017-11-29 MED ORDER — MELOXICAM 15 MG PO TABS: 15.0000 mg | ORAL_TABLET | ORAL | 0 refills | Status: DC | PRN

## 2017-11-29 MED ORDER — CYCLOBENZAPRINE HCL 5 MG PO TABS: 5.0000 mg | ORAL_TABLET | Freq: Three times a day (TID) | ORAL | 0 refills | Status: DC | PRN

## 2017-11-29 NOTE — Patient Instructions (Signed)

## 2017-11-29 NOTE — Progress Notes (Signed)
  Subjective:     Patient ID: Miguel [name], male   DOB: November 08, 1955, 62 y.o.   MRN: [id number]  HPI Seen for low back pain.  Location is right upper lumbar region.  First noted about 2 weeks ago.  No injury.  Has had similar pain in the past.  He states his muscles feel tight.  No radiculitis symptoms.  No numbness or weakness.  Pain is moderate.  Not relieved with over-the-counter medications.  In the past he has done well with combination of meloxicam and Flexeril and rare hydrocodone at night for severe pain.  Past Medical History:  Diagnosis Date  . Chicken pox   . GERD (gastroesophageal reflux disease)   . Hypertension    Past Surgical History:  Procedure Laterality Date  . MANDIBLE SURGERY  1980  . nose repair  1970   deviated septum  . REPAIR ANKLE LIGAMENT  1975  . REPAIR KNEE LIGAMENT  1980    accident  . TONSILLECTOMY      reports that he has never smoked. He has never used smokeless tobacco. He reports that he does not drink alcohol or use drugs. family history includes Cancer in his mother; Diabetes in his sister; Heart disease in his sister; Hyperlipidemia in his father, mother, and sister; Hypertension in his father and mother. No Known Allergies   Review of Systems  Constitutional: Negative for activity change, appetite change and fever.  Respiratory: Negative for cough and shortness of breath.   Cardiovascular: Negative for chest pain and leg swelling.  Gastrointestinal: Negative for abdominal pain and vomiting.  Genitourinary: Negative for dysuria, flank pain and hematuria.  Musculoskeletal: Positive for back pain. Negative for joint swelling.  Neurological: Negative for weakness and numbness.       Objective:   Physical Exam  Constitutional: He is oriented to person, place, and time. He appears well-developed and well-nourished. No distress.  Neck: No thyromegaly present.  Cardiovascular: Normal rate, regular rhythm and normal heart sounds.  No murmur  heard. Pulmonary/Chest: Effort normal and breath sounds normal. No respiratory distress. He has no wheezes. He has no rales.  Musculoskeletal: He exhibits no edema.  Neurological: He is alert and oriented to person, place, and time. He has normal reflexes. No cranial nerve deficit.  Skin: No rash noted.       Assessment:     Right sided upper lumbar back pain.  Suspect muscular    Plan:     -Continue walking and aerobic exercise as tolerated -Heat or ice for symptomatic relief -Refilled meloxicam 15 mg once daily, Flexeril 5 mg 1-2 nightly as needed for muscle spasm. -Wrote for limited hydrocodone for severe pain #20 with no refill -Consider physical therapy if not resolved in 2 weeks  Kristian Covey MD [facility] at [facility]

## 2017-12-07 ENCOUNTER — Encounter: Payer: Self-pay | Admitting: Family Medicine

## 2017-12-07 ENCOUNTER — Other Ambulatory Visit: Payer: Self-pay

## 2017-12-07 ENCOUNTER — Ambulatory Visit (INDEPENDENT_AMBULATORY_CARE_PROVIDER_SITE_OTHER): Payer: BLUE CROSS/BLUE SHIELD | Admitting: Family Medicine

## 2017-12-07 VITALS — BP 140/90 | HR 86 | Temp 97.9°F | Ht 66.75 in | Wt 216.8 lb

## 2017-12-07 DIAGNOSIS — Z0000 Encounter for general adult medical examination without abnormal findings: Secondary | ICD-10-CM

## 2017-12-07 DIAGNOSIS — Z125 Encounter for screening for malignant neoplasm of prostate: Secondary | ICD-10-CM

## 2017-12-07 LAB — HEPATIC FUNCTION PANEL
ALT: 27 U/L (ref 0–53)
AST: 21 U/L (ref 0–37)
Albumin: 4.5 g/dL (ref 3.5–5.2)
Alkaline Phosphatase: 73 U/L (ref 39–117)
BILIRUBIN DIRECT: 0.1 mg/dL (ref 0.0–0.3)
BILIRUBIN TOTAL: 0.6 mg/dL (ref 0.2–1.2)
Total Protein: 7.5 g/dL (ref 6.0–8.3)

## 2017-12-07 LAB — BASIC METABOLIC PANEL
BUN: 15 mg/dL (ref 6–23)
CO2: 31 mEq/L (ref 19–32)
Calcium: 9.9 mg/dL (ref 8.4–10.5)
Chloride: 103 mEq/L (ref 96–112)
Creatinine, Ser: 0.95 mg/dL (ref 0.40–1.50)
GFR: 85.2 mL/min (ref >60.00)
GLUCOSE: 99 mg/dL (ref 70–99)
POTASSIUM: 4.6 meq/L (ref 3.5–5.1)
Sodium: 141 mEq/L (ref 135–145)

## 2017-12-07 LAB — LIPID PANEL
CHOL/HDL RATIO: 5
Cholesterol: 145 mg/dL (ref 0–200)
HDL: 28.8 mg/dL — ABNORMAL LOW (ref >39.00)
NonHDL: 115.94
Triglycerides: 212 mg/dL — ABNORMAL HIGH (ref 0.0–149.0)
VLDL: 42.4 mg/dL — AB (ref 0.0–40.0)

## 2017-12-07 LAB — CBC WITH DIFFERENTIAL/PLATELET
BASOS ABS: 0.1 10*3/uL (ref 0.0–0.1)
BASOS PCT: 1.8 % (ref 0.0–3.0)
EOS ABS: 0.2 10*3/uL (ref 0.0–0.7)
Eosinophils Relative: 3.3 % (ref 0.0–5.0)
HEMATOCRIT: 47 % (ref 39.0–52.0)
HEMOGLOBIN: 16.1 g/dL (ref 13.0–17.0)
LYMPHS PCT: 27.9 % (ref 12.0–46.0)
Lymphs Abs: 1.9 10*3/uL (ref 0.7–4.0)
MCHC: 34.2 g/dL (ref 30.0–36.0)
MCV: 92.5 fl (ref 78.0–100.0)
MONOS PCT: 10.8 % (ref 3.0–12.0)
Monocytes Absolute: 0.8 10*3/uL (ref 0.1–1.0)
Neutro Abs: 3.9 10*3/uL (ref 1.4–7.7)
Neutrophils Relative %: 56.2 % (ref 43.0–77.0)
Platelets: 303 10*3/uL (ref 150.0–400.0)
RBC: 5.08 Mil/uL (ref 4.22–5.81)
RDW: 12.7 % (ref 11.5–15.5)
WBC: 7 10*3/uL (ref 4.0–10.5)

## 2017-12-07 LAB — TSH: TSH: 3.14 u[IU]/mL (ref 0.35–4.50)

## 2017-12-07 LAB — LDL CHOLESTEROL, DIRECT: LDL DIRECT: 79 mg/dL

## 2017-12-07 LAB — PSA: PSA: 1.02 ng/mL (ref 0.10–4.00)

## 2017-12-07 MED ORDER — AMLODIPINE BESYLATE 5 MG PO TABS: 5.0000 mg | ORAL_TABLET | Freq: Every day | ORAL | 11 refills | Status: DC

## 2017-12-07 NOTE — Patient Instructions (Signed)
Monitor blood pressure and be in touch if not consistently < 140/90.    Try to lose some weight.

## 2017-12-07 NOTE — Progress Notes (Signed)
Subjective:     Patient ID: Miguel [name], male   DOB: 1955-08-02, 62 y.o.   MRN: [id number]  HPI Here for physical exam.  He has history of hypertension treated with losartan.  History of GERD which is controlled with omeprazole.  He had some recent flareup with back pain and back pain is greatly improved at this time.  Patient states his blood pressure recently has been "all over the place".  He had several readings up to 150 systolic.  He states he can usually feel when his blood pressures are up.  No headaches.  No chest pain.  No dietary changes.  No history of hepatitis C screening.  Low risk.  Colonoscopy up-to-date.  Tetanus up-to-date.  Declines flu vaccine  Past Medical History:  Diagnosis Date  . Chicken pox   . GERD (gastroesophageal reflux disease)   . Hypertension    Past Surgical History:  Procedure Laterality Date  . MANDIBLE SURGERY  1980  . nose repair  1970   deviated septum  . REPAIR ANKLE LIGAMENT  1975  . REPAIR KNEE LIGAMENT  1980    accident  . TONSILLECTOMY      reports that he has never smoked. He has never used smokeless tobacco. He reports that he does not drink alcohol or use drugs. family history includes Cancer in his mother; Diabetes in his sister; Heart disease in his sister; Hyperlipidemia in his father, mother, and sister; Hypertension in his father and mother. No Known Allergies   Review of Systems  Constitutional: Negative for activity change, appetite change, fatigue and fever.  HENT: Negative for congestion, ear pain and trouble swallowing.   Eyes: Negative for pain and visual disturbance.  Respiratory: Negative for cough, shortness of breath and wheezing.   Cardiovascular: Negative for chest pain and palpitations.  Gastrointestinal: Negative for abdominal distention, abdominal pain, blood in stool, constipation, diarrhea, nausea, rectal pain and vomiting.  Endocrine: Negative for polydipsia and polyuria.  Genitourinary: Negative for  dysuria, hematuria and testicular pain.  Musculoskeletal: Negative for arthralgias and joint swelling.  Skin: Negative for rash.  Neurological: Negative for dizziness, syncope and headaches.  Hematological: Negative for adenopathy.  Psychiatric/Behavioral: Negative for confusion and dysphoric mood.       Objective:   Physical Exam  Constitutional: He is oriented to person, place, and time. He appears well-developed and well-nourished. No distress.  HENT:  Head: Normocephalic and atraumatic.  Right Ear: External ear normal.  Left Ear: External ear normal.  Mouth/Throat: Oropharynx is clear and moist.  Eyes: Pupils are equal, round, and reactive to light. Conjunctivae and EOM are normal.  Neck: Normal range of motion. Neck supple. No thyromegaly present.  Cardiovascular: Normal rate, regular rhythm and normal heart sounds.  No murmur heard. Pulmonary/Chest: No respiratory distress. He has no wheezes. He has no rales.  Abdominal: Soft. Bowel sounds are normal. He exhibits no distension and no mass. There is no tenderness. There is no rebound and no guarding.  Musculoskeletal: He exhibits no edema.  Lymphadenopathy:    He has no cervical adenopathy.  Neurological: He is alert and oriented to person, place, and time. He displays normal reflexes. No cranial nerve deficit.  Skin: No rash noted.  Scattered well-demarcated scaly brownish lesions on the back consistent with seborrheic keratoses.  No concerning lesions noted.  Psychiatric: He has a normal mood and affect.       Assessment:     Physical exam.  Patient has hypertension currently suboptimally controlled by  several home readings and also repeat here 140/90    Plan:     -Flu vaccine declined -Discussed shingles vaccine he will check on insurance coverage -Check labs including hepatitis C antibody -He is encouraged to lose some weight -Add amlodipine 5 mg daily to his losartan.  Give feedback over the next few weeks  regarding blood pressure if not consistently less than 140/90 -Discussed benefits of regular aerobic exercise  Kristian Covey MD [facility] at [facility]

## 2017-12-08 LAB — HEPATITIS C ANTIBODY
HEP C AB: NONREACTIVE
SIGNAL TO CUT-OFF: 0.03 (ref <1.00)

## 2017-12-14 DIAGNOSIS — H52223 Regular astigmatism, bilateral: Secondary | ICD-10-CM | POA: Diagnosis not present

## 2017-12-14 DIAGNOSIS — H5213 Myopia, bilateral: Secondary | ICD-10-CM | POA: Diagnosis not present

## 2017-12-14 DIAGNOSIS — H524 Presbyopia: Secondary | ICD-10-CM | POA: Diagnosis not present

## 2017-12-14 DIAGNOSIS — H2513 Age-related nuclear cataract, bilateral: Secondary | ICD-10-CM | POA: Diagnosis not present

## 2017-12-22 ENCOUNTER — Other Ambulatory Visit: Payer: Self-pay | Admitting: Family Medicine

## 2017-12-23 NOTE — Telephone Encounter (Signed)
Last OV 12/07/17, No future OV  Last filled 11/29/17, # 30 with 0 refills  Ok to continue?

## 2017-12-23 NOTE — Telephone Encounter (Signed)
Refill once 

## 2018-01-02 ENCOUNTER — Encounter: Payer: Self-pay | Admitting: Family Medicine

## 2018-01-02 ENCOUNTER — Other Ambulatory Visit: Payer: Self-pay | Admitting: Family Medicine

## 2018-01-03 ENCOUNTER — Other Ambulatory Visit: Payer: Self-pay

## 2018-01-03 MED ORDER — CYCLOBENZAPRINE HCL 5 MG PO TABS: 5.0000 mg | ORAL_TABLET | Freq: Three times a day (TID) | ORAL | 0 refills | Status: DC | PRN

## 2018-01-03 MED ORDER — MELOXICAM 15 MG PO TABS: 15.0000 mg | ORAL_TABLET | ORAL | 0 refills | Status: DC | PRN

## 2018-01-03 MED ORDER — AMLODIPINE BESYLATE 5 MG PO TABS: 5.0000 mg | ORAL_TABLET | Freq: Every day | ORAL | 3 refills | Status: DC

## 2018-01-03 NOTE — Telephone Encounter (Signed)
Last OV 12/07/17, No future OV  Mobic last filled 12/26/17, # 30 with 0 refills  Flexeril last filled 11/29/17, # 30 with 0 refills  Okay to continue filling?

## 2018-01-03 NOTE — Telephone Encounter (Signed)
Refill both once 

## 2018-05-18 ENCOUNTER — Other Ambulatory Visit: Payer: Self-pay | Admitting: Family Medicine

## 2018-11-11 ENCOUNTER — Other Ambulatory Visit: Payer: Self-pay | Admitting: Family Medicine

## 2019-02-13 ENCOUNTER — Other Ambulatory Visit: Payer: Self-pay | Admitting: Family Medicine

## 2019-02-25 ENCOUNTER — Other Ambulatory Visit: Payer: Self-pay | Admitting: Family Medicine

## 2019-03-20 ENCOUNTER — Other Ambulatory Visit: Payer: Self-pay | Admitting: Family Medicine

## 2019-06-04 ENCOUNTER — Ambulatory Visit (INDEPENDENT_AMBULATORY_CARE_PROVIDER_SITE_OTHER): Payer: BC Managed Care – PPO | Admitting: Family Medicine

## 2019-06-04 ENCOUNTER — Encounter: Payer: Self-pay | Admitting: Family Medicine

## 2019-06-04 ENCOUNTER — Other Ambulatory Visit: Payer: Self-pay

## 2019-06-04 VITALS — BP 122/78 | HR 90 | Temp 98.3°F | Ht 67.0 in | Wt 221.7 lb

## 2019-06-04 DIAGNOSIS — Z125 Encounter for screening for malignant neoplasm of prostate: Secondary | ICD-10-CM

## 2019-06-04 DIAGNOSIS — I10 Essential (primary) hypertension: Secondary | ICD-10-CM | POA: Diagnosis not present

## 2019-06-04 DIAGNOSIS — Z0000 Encounter for general adult medical examination without abnormal findings: Secondary | ICD-10-CM

## 2019-06-04 LAB — HEPATIC FUNCTION PANEL
ALT: 29 U/L (ref 0–53)
AST: 24 U/L (ref 0–37)
Albumin: 4.5 g/dL (ref 3.5–5.2)
Alkaline Phosphatase: 79 U/L (ref 39–117)
Bilirubin, Direct: 0.1 mg/dL (ref 0.0–0.3)
Total Bilirubin: 0.6 mg/dL (ref 0.2–1.2)
Total Protein: 7.5 g/dL (ref 6.0–8.3)

## 2019-06-04 LAB — CBC WITH DIFFERENTIAL/PLATELET
Basophils Absolute: 0.2 10*3/uL — ABNORMAL HIGH (ref 0.0–0.1)
Basophils Relative: 1.9 % (ref 0.0–3.0)
Eosinophils Absolute: 0.2 10*3/uL (ref 0.0–0.7)
Eosinophils Relative: 2.4 % (ref 0.0–5.0)
HCT: 41.8 % (ref 39.0–52.0)
Hemoglobin: 13.9 g/dL (ref 13.0–17.0)
Lymphocytes Relative: 30.4 % (ref 12.0–46.0)
Lymphs Abs: 2.4 10*3/uL (ref 0.7–4.0)
MCHC: 33.3 g/dL (ref 30.0–36.0)
MCV: 86.3 fl (ref 78.0–100.0)
Monocytes Absolute: 0.9 10*3/uL (ref 0.1–1.0)
Monocytes Relative: 11.3 % (ref 3.0–12.0)
Neutro Abs: 4.3 10*3/uL (ref 1.4–7.7)
Neutrophils Relative %: 54 % (ref 43.0–77.0)
Platelets: 376 10*3/uL (ref 150.0–400.0)
RBC: 4.85 Mil/uL (ref 4.22–5.81)
RDW: 15.1 % (ref 11.5–15.5)
WBC: 7.9 10*3/uL (ref 4.0–10.5)

## 2019-06-04 LAB — LIPID PANEL
Cholesterol: 139 mg/dL (ref 0–200)
HDL: 29.9 mg/dL — ABNORMAL LOW (ref >39.00)
LDL Cholesterol: 76 mg/dL (ref 0–99)
NonHDL: 109.14
Total CHOL/HDL Ratio: 5
Triglycerides: 166 mg/dL — ABNORMAL HIGH (ref 0.0–149.0)
VLDL: 33.2 mg/dL (ref 0.0–40.0)

## 2019-06-04 LAB — TSH: TSH: 2.71 u[IU]/mL (ref 0.35–4.50)

## 2019-06-04 LAB — BASIC METABOLIC PANEL
BUN: 15 mg/dL (ref 6–23)
CO2: 28 mEq/L (ref 19–32)
Calcium: 9.7 mg/dL (ref 8.4–10.5)
Chloride: 103 mEq/L (ref 96–112)
Creatinine, Ser: 1.05 mg/dL (ref 0.40–1.50)
GFR: 71.08 mL/min (ref >60.00)
Glucose, Bld: 111 mg/dL — ABNORMAL HIGH (ref 70–99)
Potassium: 4.4 mEq/L (ref 3.5–5.1)
Sodium: 138 mEq/L (ref 135–145)

## 2019-06-04 LAB — PSA: PSA: 0.83 ng/mL (ref 0.10–4.00)

## 2019-06-04 NOTE — Progress Notes (Signed)
Subjective:     Patient ID: Miguel [name], male   DOB: [birth year], 64 y.o.   MRN: [id number]  HPI Joe is seen for physical exam.  He has hypertension which has been controlled with losartan and amlodipine.  He thinks he may have injured his knee about a month ago.  Does not recall specific injury but has had some pain left anterior knee just superior to the upper pole patella.  He had pain with getting up and down out of a chair.  No locking or giving way.  No visible swelling.  No ecchymosis.  No warmth.  He is in the middle of Covid vaccines.  No history of shingles vaccine.  He wants to get Covid vaccine out of the way first.  Tetanus is up-to-date.  Reviewed with no changes:  Past Medical History:  Diagnosis Date  . Chicken pox   . GERD (gastroesophageal reflux disease)   . Hypertension    Past Surgical History:  Procedure Laterality Date  . MANDIBLE SURGERY  1980  . nose repair  1970   deviated septum  . REPAIR ANKLE LIGAMENT  1975  . REPAIR KNEE LIGAMENT  1980    accident  . TONSILLECTOMY      reports that he has never smoked. He has never used smokeless tobacco. He reports that he does not drink alcohol or use drugs. family history includes Cancer in his mother; Diabetes in his sister; Heart disease in his sister; Hyperlipidemia in his father, mother, and sister; Hypertension in his father and mother. No Known Allergies  Wt Readings from Last 3 Encounters:  06/04/19 221 lb 11.2 oz (100.6 kg)  12/07/17 216 lb 12.8 oz (98.3 kg)  11/29/17 219 lb 8 oz (99.6 kg)   Health Maintenance  Topic Date Due  . HIV Screening  Never done  . INFLUENZA VACCINE  09/30/2019  . COLONOSCOPY  06/08/2021  . TETANUS/TDAP  05/06/2025  . Hepatitis C Screening  Completed      Review of Systems  Constitutional: Negative for activity change, appetite change, fatigue and fever.  HENT: Negative for congestion, ear pain and trouble swallowing.   Eyes: Negative for pain and visual  disturbance.  Respiratory: Negative for cough, shortness of breath and wheezing.   Cardiovascular: Negative for chest pain and palpitations.  Gastrointestinal: Negative for abdominal distention, abdominal pain, blood in stool, constipation, diarrhea, nausea, rectal pain and vomiting.  Endocrine: Negative for polydipsia and polyuria.  Genitourinary: Negative for dysuria, hematuria and testicular pain.  Musculoskeletal: Negative for arthralgias and joint swelling.  Skin: Negative for rash.  Neurological: Negative for dizziness, syncope and headaches.  Hematological: Negative for adenopathy.  Psychiatric/Behavioral: Negative for confusion and dysphoric mood.       Objective:   Physical Exam Vitals reviewed.  Constitutional:      General: He is not in acute distress.    Appearance: He is well-developed.  HENT:     Head: Normocephalic and atraumatic.     Right Ear: External ear normal.     Left Ear: External ear normal.  Eyes:     Conjunctiva/sclera: Conjunctivae normal.     Pupils: Pupils are equal, round, and reactive to light.  Neck:     Thyroid: No thyromegaly.  Cardiovascular:     Rate and Rhythm: Normal rate and regular rhythm.     Heart sounds: Normal heart sounds. No murmur.  Pulmonary:     Effort: No respiratory distress.     Breath sounds: No wheezing or  rales.  Abdominal:     General: Bowel sounds are normal. There is no distension.     Palpations: Abdomen is soft.     Tenderness: There is no abdominal tenderness. There is no guarding or rebound.     Hernia: A hernia is present.     Comments: He has abdominal wall hernia just superior to the umbilicus.  This is soft and nontender  Musculoskeletal:     Cervical back: Normal range of motion and neck supple.     Comments: Left knee reveals scar from previous surgery.  No effusion.  Full range of motion.  No localized tenderness.  Full strength with knee extension.  No medial or lateral joint line tenderness.  Ligament  testing is stable  Lymphadenopathy:     Cervical: No cervical adenopathy.  Skin:    Findings: No rash.  Neurological:     Mental Status: He is alert and oriented to person, place, and time.     Cranial Nerves: No cranial nerve deficit.     Deep Tendon Reflexes: Reflexes normal.        Assessment:     Physical exam.  He has hypertension which is well controlled.  We discussed the following health maintenance issues    Plan:     -Discussed Shingrix vaccine and he will consider after getting Covid vaccines out of the way  -Obtain follow-up screening labs  -Continue current blood pressure medications  Eulas Post MD [facility] at [facility]

## 2019-06-04 NOTE — Patient Instructions (Signed)
Preventive Care 41-64 Years Old, Male Preventive care refers to lifestyle choices and visits with your health care provider that can promote health and wellness. This includes:  A yearly physical exam. This is also called an annual well check.  Regular dental and eye exams.  Immunizations.  Screening for certain conditions.  Healthy lifestyle choices, such as eating a healthy diet, getting regular exercise, not using drugs or products that contain nicotine and tobacco, and limiting alcohol use. What can I expect for my preventive care visit? Physical exam Your health care provider will check:  Height and weight. These may be used to calculate body mass index (BMI), which is a measurement that tells if you are at a healthy weight.  Heart rate and blood pressure.  Your skin for abnormal spots. Counseling Your health care provider may ask you questions about:  Alcohol, tobacco, and drug use.  Emotional well-being.  Home and relationship well-being.  Sexual activity.  Eating habits.  Work and work Statistician. What immunizations do I need?  Influenza (flu) vaccine  This is recommended every year. Tetanus, diphtheria, and pertussis (Tdap) vaccine  You may need a Td booster every 10 years. Varicella (chickenpox) vaccine  You may need this vaccine if you have not already been vaccinated. Zoster (shingles) vaccine  You may need this after age 64. after age 64. Measles, mumps, and rubella (MMR) vaccine  You may need at least one dose of MMR if you were born in 1957 or later. You may also need a second dose. Pneumococcal conjugate (PCV13) vaccine  You may need this if you have certain conditions and were not previously vaccinated. Pneumococcal polysaccharide (PPSV23) vaccine  You may need one or two doses if you smoke cigarettes or if you have certain conditions. Meningococcal conjugate (MenACWY) vaccine  You may need this if you have certain conditions. Hepatitis A  vaccine  You may need this if you have certain conditions or if you travel or work in places where you may be exposed to hepatitis A. Hepatitis B vaccine  You may need this if you have certain conditions or if you travel or work in places where you may be exposed to hepatitis B. Haemophilus influenzae type b (Hib) vaccine  You may need this if you have certain risk factors. Human papillomavirus (HPV) vaccine  If recommended by your health care provider, you may need three doses over 6 months. You may receive vaccines as individual doses or as more than one vaccine together in one shot (combination vaccines). Talk with your health care provider about the risks and benefits of combination vaccines. What tests do I need? Blood tests  Lipid and cholesterol levels. These may be checked every 5 years, or more frequently if you are over 60 years old.  Hepatitis C test.  Hepatitis B test. Screening  Lung cancer screening. You may have this screening every year starting at age 43 if you have a 30-pack-year history of smoking and currently smoke or have quit within the past 15 years.  Prostate cancer screening. Recommendations will vary depending on your family history and other risks.  Colorectal cancer screening. All adults should have this screening starting at age 72 and continuing until age 2. Your health care provider may recommend screening at age 14 if you are at increased risk. at increased risk. You will have tests every 1-10 years, depending on your results and the type of screening test.  Diabetes screening. This is done by checking your blood sugar (glucose) after you have not eaten  for a while (fasting). You may have this done every 1-3 years.  Sexually transmitted disease (STD) testing. Follow these instructions at home: Eating and drinking  Eat a diet that includes fresh fruits and vegetables, whole grains, lean protein, and low-fat dairy products.  Take vitamin and mineral supplements as  recommended by your health care provider.  Do not drink alcohol if your health care provider tells you not to drink.  If you drink alcohol: ? Limit how much you have to 0-2 drinks a day. ? Be aware of how much alcohol is in your drink. In the [place] one drink equals one 12 oz bottle of beer (355 mL), one 5 oz glass of wine (148 mL), or one 1 oz glass of hard liquor (44 mL). Lifestyle  Take daily care of your teeth and gums.  Stay active. Exercise for at least 30 minutes on 5 or more days each week.  Do not use any products that contain nicotine or tobacco, such as cigarettes, e-cigarettes, and chewing tobacco. If you need help quitting, ask your health care provider.  If you are sexually active, practice safe sex. Use a condom or other form of protection to prevent STIs (sexually transmitted infections).  Talk with your health care provider about taking a low-dose aspirin every day starting at age 65. What's next?  Go to your health care provider once a year for a well check visit.  Ask your health care provider how often you should have your eyes and teeth checked.  Stay up to date on all vaccines. This information is not intended to replace advice given to you by your health care provider. Make sure you discuss any questions you have with your health care provider. Document Revised: 02/09/2018 Document Reviewed: 02/09/2018 Elsevier Patient Education  [place].  Consider shingles vaccine at some point later this year.

## 2019-07-02 ENCOUNTER — Other Ambulatory Visit: Payer: Self-pay | Admitting: Family Medicine

## 2019-07-03 MED ORDER — AMLODIPINE BESYLATE 5 MG PO TABS: 5.0000 mg | ORAL_TABLET | Freq: Every day | ORAL | 0 refills | Status: DC

## 2019-07-04 ENCOUNTER — Encounter: Payer: Self-pay | Admitting: Family Medicine

## 2019-07-06 MED ORDER — AMLODIPINE BESYLATE 5 MG PO TABS: 5.0000 mg | ORAL_TABLET | Freq: Every day | ORAL | 2 refills | Status: DC

## 2019-07-06 MED ORDER — LOSARTAN POTASSIUM 100 MG PO TABS: 100.0000 mg | ORAL_TABLET | Freq: Every day | ORAL | 0 refills | Status: DC

## 2019-10-31 ENCOUNTER — Telehealth: Payer: Self-pay | Admitting: Family Medicine

## 2019-10-31 DIAGNOSIS — Z1379 Encounter for other screening for genetic and chromosomal anomalies: Secondary | ICD-10-CM

## 2019-10-31 NOTE — Telephone Encounter (Signed)
Patient called to get information from Dr. Caryl Never on where to go get Bracca2 Gene testing done   His grandma, mom, sister and niece have all been diagnosed with breast cancer and he just wants to make sure that he hasn't passed the gene.  He said that you can either send him information through MyChart or call him.   Please advise

## 2019-10-31 NOTE — Telephone Encounter (Signed)
Please advise I am not familiar with this

## 2019-11-01 NOTE — Telephone Encounter (Signed)
OK to place [facility].  They have counselors who can advise and order.

## 2019-11-01 NOTE — Telephone Encounter (Signed)
Can you please order amb. I tried to place the order cannot link with bracca2 testing it is not coming up in sx

## 2019-11-07 NOTE — Addendum Note (Signed)
Addended by: Kathreen Devoid on: 11/07/2019 03:21 PM   Modules accepted: Orders

## 2019-11-22 ENCOUNTER — Telehealth: Payer: Self-pay | Admitting: Genetic Counselor

## 2019-11-22 NOTE — Telephone Encounter (Signed)
Received a genetic counseling referral from Dr. Caryl Never for fhx of breast cancer. [name] returned my call and has been scheduled to see Clydie Braun on 10/18 at 9am. Pt aware to arrive 15 minutes early.

## 2019-11-28 ENCOUNTER — Other Ambulatory Visit: Payer: Self-pay | Admitting: Family Medicine

## 2019-12-05 ENCOUNTER — Ambulatory Visit (INDEPENDENT_AMBULATORY_CARE_PROVIDER_SITE_OTHER): Payer: BC Managed Care – PPO | Admitting: Family Medicine

## 2019-12-05 ENCOUNTER — Other Ambulatory Visit: Payer: Self-pay

## 2019-12-05 ENCOUNTER — Encounter: Payer: Self-pay | Admitting: Family Medicine

## 2019-12-05 VITALS — BP 108/68 | HR 84 | Temp 98.3°F | Ht 67.0 in | Wt 213.4 lb

## 2019-12-05 DIAGNOSIS — R7309 Other abnormal glucose: Secondary | ICD-10-CM

## 2019-12-05 LAB — GLUCOSE, POCT (MANUAL RESULT ENTRY): POC Glucose: 107 mg/dl — AB (ref 70–99)

## 2019-12-05 LAB — POCT GLYCOSYLATED HEMOGLOBIN (HGB A1C): Hemoglobin A1C: 5.8 % — AB (ref 4.0–5.6)

## 2019-12-05 NOTE — Patient Instructions (Signed)
Hyperglycemia Hyperglycemia occurs when the level of sugar (glucose) in the blood is too high. Glucose is a type of sugar that provides the body's main source of energy. Certain hormones (insulin and glucagon) control the level of glucose in the blood. Insulin lowers blood glucose, and glucagon increases blood glucose. Hyperglycemia can result from having too little insulin in the bloodstream, or from the body not responding normally to insulin. Hyperglycemia occurs most often in people who have diabetes (diabetes mellitus), but it can happen in people who do not have diabetes. It can develop quickly, and it can be life-threatening if it causes you to become severely dehydrated (diabetic ketoacidosis or hyperglycemic hyperosmolar state). Severe hyperglycemia is a medical emergency. What are the causes? If you have diabetes, hyperglycemia may be caused by:  Diabetes medicine.  Medicines that increase blood glucose or affect your diabetes control.  Not eating enough, or not eating often enough.  Changes in physical activity level.  Being sick or having an infection. If you have prediabetes or undiagnosed diabetes:  Hyperglycemia may be caused by those conditions. If you do not have diabetes, hyperglycemia may be caused by:  Certain medicines, including steroid medicines, beta-blockers, epinephrine, and thiazide diuretics.  Stress.  Serious illness.  Surgery.  Diseases of the pancreas.  Infection. What increases the risk? Hyperglycemia is more likely to develop in people who have risk factors for diabetes, such as:  Having a family member with diabetes.  Having a gene for type 1 diabetes that is passed from parent to child (inherited).  Living in an area with cold weather conditions.  Exposure to certain viruses.  Certain conditions in which the body's disease-fighting (immune) system attacks itself (autoimmune disorders).  Being overweight or obese.  Having an inactive  (sedentary) lifestyle.  Having been diagnosed with insulin resistance.  Having a history of prediabetes, gestational diabetes, or polycystic ovarian syndrome (PCOS).  Being of American-Indian, African-American, Hispanic/Latino, or Asian/Pacific Islander descent. What are the signs or symptoms? Hyperglycemia may not cause any symptoms. If you do have symptoms, they may include early warning signs, such as:  Increased thirst.  Hunger.  Feeling very tired.  Needing to urinate more often than usual.  Blurry vision. Other symptoms may develop if hyperglycemia gets worse, such as:  Dry mouth.  Loss of appetite.  Fruity-smelling breath.  Weakness.  Unexpected or rapid weight gain or weight loss.  Tingling or numbness in the hands or feet.  Headache.  Skin that does not quickly return to normal after being lightly pinched and released (poor skin turgor).  Abdominal pain.  Cuts or bruises that are slow to heal. How is this diagnosed? Hyperglycemia is diagnosed with a blood test to measure your blood glucose level. This blood test is usually done while you are having symptoms. Your health care provider may also do a physical exam and review your medical history. You may have more tests to determine the cause of your hyperglycemia, such as:  A fasting blood glucose (FBG) test. You will not be allowed to eat (you will fast) for at least 8 hours before a blood sample is taken.  An A1c (hemoglobin A1c) blood test. This provides information about blood glucose control over the previous 2-3 months.  An oral glucose tolerance test (OGTT). This measures your blood glucose at two times: ? After fasting. This is your baseline blood glucose level. ? Two hours after drinking a beverage that contains glucose. How is this treated? Treatment depends on the cause   of your hyperglycemia. Treatment may include:  Taking medicine to regulate your blood glucose levels. If you take insulin or  other diabetes medicines, your medicine or dosage may be adjusted.  Lifestyle changes, such as exercising more, eating healthier foods, or losing weight.  Treating an illness or infection, if this caused your hyperglycemia.  Checking your blood glucose more often.  Stopping or reducing steroid medicines, if these caused your hyperglycemia. If your hyperglycemia becomes severe and it results in hyperglycemic hyperosmolar state, you must be hospitalized and given IV fluids. Follow these instructions at home:  General instructions  Take over-the-counter and prescription medicines only as told by your health care provider.  Do not use any products that contain nicotine or tobacco, such as cigarettes and e-cigarettes. If you need help quitting, ask your health care provider.  Limit alcohol intake to no more than 1 drink per day for nonpregnant women and 2 drinks per day for men. One drink equals 12 oz of beer, 5 oz of wine, or 1 oz of hard liquor.  Learn to manage stress. If you need help with this, ask your health care provider.  Keep all follow-up visits as told by your health care provider. This is important. Eating and drinking   Maintain a healthy weight.  Exercise regularly, as directed by your health care provider.  Stay hydrated, especially when you exercise, get sick, or spend time in hot temperatures.  Eat healthy foods, such as: ? Lean proteins. ? Complex carbohydrates. ? Fresh fruits and vegetables. ? Low-fat dairy products. ? Healthy fats.  Drink enough fluid to keep your urine clear or pale yellow. If you have diabetes:  Make sure you know the symptoms of hyperglycemia.  Follow your diabetes management plan, as told by your health care provider. Make sure you: ? Take your insulin and medicines as directed. ? Follow your exercise plan. ? Follow your meal plan. Eat on time, and do not skip meals. ? Check your blood glucose as often as directed. Make sure to  check your blood glucose before and after exercise. If you exercise longer or in a different way than usual, check your blood glucose more often. ? Follow your sick day plan whenever you cannot eat or drink normally. Make this plan in advance with your health care provider.  Share your diabetes management plan with people in your workplace, school, and household.  Check your urine for ketones when you are ill and as told by your health care provider.  Carry a medical alert card or wear medical alert jewelry. Contact a health care provider if:  Your blood glucose is at or above 240 mg/dL (13.3 mmol/L) for 2 days in a row.  You have problems keeping your blood glucose in your target range.  You have frequent episodes of hyperglycemia. Get help right away if:  You have difficulty breathing.  You have a change in how you think, feel, or act (mental status).  You have nausea or vomiting that does not go away. These symptoms may represent a serious problem that is an emergency. Do not wait to see if the symptoms will go away. Get medical help right away. Call your local emergency services (911 in the [place] Do not drive yourself to the hospital. Summary  Hyperglycemia occurs when the level of sugar (glucose) in the blood is too high.  Hyperglycemia is diagnosed with a blood test to measure your blood glucose level. This blood test is usually done while you are   having symptoms. Your health care provider may also do a physical exam and review your medical history.  If you have diabetes, follow your diabetes management plan as told by your health care provider.  Contact your health care provider if you have problems keeping your blood glucose in your target range. This information is not intended to replace advice given to you by your health care provider. Make sure you discuss any questions you have with your health care provider. Document Revised: 11/03/2015 Document Reviewed:  11/03/2015 Elsevier Patient Education  2020 Elsevier Inc.  

## 2019-12-05 NOTE — Progress Notes (Signed)
Established Patient Office Visit  Subjective:  Patient ID: Miguel [name], male    DOB: 06/09/1955  Age: 64 y.o. MRN: [id number]  CC:  Chief Complaint  Patient presents with   Follow-up    Doing okay    HPI Miguel [name] presents for evaluation of elevated blood sugars noted on physical 6 months ago.  He had glucose of 111 fasting.  He does have positive family history of type 2 diabetes in a sister.  His parents did not have diabetes.  He has not had any polyuria or polydipsia.  He has hypertension which is controlled with losartan and amlodipine.  He has made some positive dietary changes.  He is mostly reduced high glycemic foods.  Not exercising regularly.  His weight is down about 8 pounds from last visit.  We had recommended follow-up today to recheck fasting blood sugar and A1c.  Past Medical History:  Diagnosis Date   Chicken pox    GERD (gastroesophageal reflux disease)    Hypertension     Past Surgical History:  Procedure Laterality Date   MANDIBLE SURGERY  1980   nose repair  1970   deviated septum   REPAIR ANKLE LIGAMENT  1975   REPAIR KNEE LIGAMENT  1980    accident   TONSILLECTOMY      Family History  Problem Relation Age of Onset   Cancer Mother        breast   Hyperlipidemia Mother    Hypertension Mother    Hyperlipidemia Father    Hypertension Father    Diabetes Sister        type 2   Hyperlipidemia Sister    Heart disease Sister    Colon cancer Neg Hx    Stomach cancer Neg Hx     Social History   Socioeconomic History   Marital status: Married    Spouse name: Not on file   Number of children: Not on file   Years of education: Not on file   Highest education level: Not on file  Occupational History   Not on file  Tobacco Use   Smoking status: Never Smoker   Smokeless tobacco: Never Used  Vaping Use   Vaping Use: Never used  Substance and Sexual Activity   Alcohol use: No   Drug use: No    Sexual activity: Not on file  Other Topics Concern   Not on file  Social History Narrative   Not on file   Social Determinants of Health   Financial Resource Strain:    Difficulty of Paying Living Expenses: Not on file  Food Insecurity:    Worried About Running Out of Food in the Last Year: Not on file   [employer] of Food in the Last Year: Not on file  Transportation Needs:    Lack of Transportation (Medical): Not on file   Lack of Transportation (Non-Medical): Not on file  Physical Activity:    Days of Exercise per Week: Not on file   Minutes of Exercise per Session: Not on file  Stress:    Feeling of Stress : Not on file  Social Connections:    Frequency of Communication with Friends and Family: Not on file   Frequency of Social Gatherings with Friends and Family: Not on file   Attends Religious Services: Not on file   Active Member of Clubs or Organizations: Not on file   Attends Banker Meetings: Not on file   Marital Status: Not on  file  Intimate Partner Violence:    Fear of Current or Ex-Partner: Not on file   Emotionally Abused: Not on file   Physically Abused: Not on file   Sexually Abused: Not on file    Outpatient Medications Prior to Visit  Medication Sig Dispense Refill   amLODipine (NORVASC) 5 MG tablet Take 1 tablet (5 mg total) by mouth daily. 90 tablet 2   aspirin EC 81 MG tablet Take 81 mg by mouth daily.     losartan (COZAAR) 100 MG tablet TAKE 1 TABLET BY MOUTH EVERY DAY 90 tablet 0   omeprazole (PRILOSEC) 20 MG capsule Take 20 mg by mouth daily.     No facility-administered medications prior to visit.    No Known Allergies  ROS Review of Systems  Constitutional: Negative for appetite change and unexpected weight change.  Respiratory: Negative for shortness of breath.   Cardiovascular: Negative for chest pain.  Endocrine: Negative for polydipsia and polyuria.      Objective:    Physical Exam Vitals  reviewed.  Constitutional:      Appearance: Normal appearance.  Cardiovascular:     Rate and Rhythm: Normal rate and regular rhythm.  Pulmonary:     Effort: Pulmonary effort is normal.     Breath sounds: Normal breath sounds. No wheezing or rales.  Neurological:     Mental Status: He is alert.     BP 108/68    Pulse 84    Temp 98.3 F (36.8 C) (Oral)    Ht 5\' 7"  (1.702 m)    Wt 213 lb 6.4 oz (96.8 kg)    SpO2 98%    BMI 33.42 kg/m  Wt Readings from Last 3 Encounters:  12/05/19 213 lb 6.4 oz (96.8 kg)  06/04/19 221 lb 11.2 oz (100.6 kg)  12/07/17 216 lb 12.8 oz (98.3 kg)     Health Maintenance Due  Topic Date Due   COVID-19 Vaccine (1) Never done   HIV Screening  Never done   INFLUENZA VACCINE  Never done    There are no preventive care reminders to display for this patient.  Lab Results  Component Value Date   TSH 2.71 06/04/2019   Lab Results  Component Value Date   WBC 7.9 06/04/2019   HGB 13.9 06/04/2019   HCT 41.8 06/04/2019   MCV 86.3 06/04/2019   PLT 376.0 06/04/2019   Lab Results  Component Value Date   NA 138 06/04/2019   K 4.4 06/04/2019   CO2 28 06/04/2019   GLUCOSE 111 (H) 06/04/2019   BUN 15 06/04/2019   CREATININE 1.05 06/04/2019   BILITOT 0.6 06/04/2019   ALKPHOS 79 06/04/2019   AST 24 06/04/2019   ALT 29 06/04/2019   PROT 7.5 06/04/2019   ALBUMIN 4.5 06/04/2019   CALCIUM 9.7 06/04/2019   GFR 71.08 06/04/2019   Lab Results  Component Value Date   CHOL 139 06/04/2019   Lab Results  Component Value Date   HDL 29.90 (L) 06/04/2019   Lab Results  Component Value Date   LDLCALC 76 06/04/2019   Lab Results  Component Value Date   TRIG 166.0 (H) 06/04/2019   Lab Results  Component Value Date   CHOLHDL 5 06/04/2019   Lab Results  Component Value Date   HGBA1C 5.8 (A) 12/05/2019      Assessment & Plan:   Problem List Items Addressed This Visit    None    Visit Diagnoses    Elevated glucose    -  Primary   Relevant  Orders   POCT glycosylated hemoglobin (Hb A1C) (Completed)   POCT glucose (manual entry) (Completed)    Patient has prediabetes by recent labs.  Fasting glucose today 107 and A1c 5.8%.  -We discussed prevention of type 2 diabetes with weight control, exercise, and diet -Continue low glycemic diet and recommend trying to institute regular exercise -Follow-up for physical in 6 months and reassess A1c then  No orders of the defined types were placed in this encounter.   Follow-up: No follow-ups on file.    Evelena Peat, MD

## 2019-12-17 ENCOUNTER — Encounter: Payer: Self-pay | Admitting: Genetic Counselor

## 2019-12-17 ENCOUNTER — Inpatient Hospital Stay: Payer: BC Managed Care – PPO | Attending: Genetic Counselor | Admitting: Genetic Counselor

## 2019-12-17 ENCOUNTER — Inpatient Hospital Stay: Payer: BC Managed Care – PPO

## 2019-12-17 ENCOUNTER — Other Ambulatory Visit: Payer: Self-pay

## 2019-12-17 DIAGNOSIS — Z803 Family history of malignant neoplasm of breast: Secondary | ICD-10-CM | POA: Diagnosis not present

## 2019-12-17 NOTE — Progress Notes (Signed)
REFERRING PROVIDER: Eulas Post, MD [place],   [id number]  PRIMARY PROVIDER:  Eulas Post, MD  PRIMARY REASON FOR VISIT:  1. Family history of breast cancer      HISTORY OF PRESENT ILLNESS:   Miguel [name], a 64 y.o. male, was seen for a [facility] cancer genetics consultation at the request of Dr. Elease Hashimoto due to a family history of breast cancer and a known BRCA2 pathogenic mutation.  Miguel [name] presents to clinic today to discuss the possibility of a hereditary predisposition to cancer, genetic testing, and to further clarify his future cancer risks, as well as potential cancer risks for family members.   Miguel [name] is a 64 y.o. male with no personal history of cancer.  His sister and other family members have reportedly tested positive for a BRCA2 mutation.  All family members are in [place], so no records are available at today's visit.  CANCER HISTORY:  Oncology History   No history exists.     Past Medical History:  Diagnosis Date  . Chicken pox   . Family history of breast cancer   . GERD (gastroesophageal reflux disease)   . Hypertension     Past Surgical History:  Procedure Laterality Date  . MANDIBLE SURGERY  1980  . nose repair  1970   deviated septum  . REPAIR ANKLE LIGAMENT  1975  . [place]    accident  . TONSILLECTOMY      Social History   Socioeconomic History  . Marital status: Married    Spouse name: Not on file  . Number of children: Not on file  . Years of education: Not on file  . Highest education level: Not on file  Occupational History  . Not on file  Tobacco Use  . Smoking status: Never Smoker  . Smokeless tobacco: Never Used  Vaping Use  . Vaping Use: Never used  Substance and Sexual Activity  . Alcohol use: No  . Drug use: No  . Sexual activity: Not on file  Other Topics Concern  . Not on file  Social History Narrative  . Not on file   Social  Determinants of Health   Financial Resource Strain:   . Difficulty of Paying Living Expenses: Not on file  Food Insecurity:   . Worried About Charity fundraiser in the Last Year: Not on file  . Ran Out of Food in the Last Year: Not on file  Transportation Needs:   . Lack of Transportation (Medical): Not on file  . Lack of Transportation (Non-Medical): Not on file  Physical Activity:   . Days of Exercise per Week: Not on file  . Minutes of Exercise per Session: Not on file  Stress:   . Feeling of Stress : Not on file  Social Connections:   . Frequency of Communication with Friends and Family: Not on file  . Frequency of Social Gatherings with Friends and Family: Not on file  . Attends Religious Services: Not on file  . Active Member of Clubs or Organizations: Not on file  . Attends Archivist Meetings: Not on file  . Marital Status: Not on file     FAMILY HISTORY:  We obtained a detailed, 4-generation family history.  Significant diagnoses are listed below: Family History  Problem Relation Age of Onset  . Hyperlipidemia Mother   . Hypertension Mother   . Breast cancer Mother  dx in her 3s; BRCA2+  . Hyperlipidemia Father   . Hypertension Father   . Diabetes Sister        type 2  . Hyperlipidemia Sister   . Heart disease Sister   . Cancer Maternal Uncle        NOS  . Heart disease Paternal Uncle   . Breast cancer Sister 1       BRCA2+  . Breast cancer Cousin        mat first cousin  . Breast cancer Cousin        mat first cousin  . Colon cancer Neg Hx   . Stomach cancer Neg Hx     The patient has an adopted son and biological daughter who are cancer free.  He has two sisters, one who had breast cancer at 71 and is BRCA2+.  This sister has a daughter who is also reportedly BRCA2 positive and was diagnosed with breast cancer at 66.  The patient's mother is living and his father is deceased.  The patient's mother was diagnosed with breast cancer in  her 23's and is reportedly BRCA2 positive.  She had two brothers and a sister.  One brother had a cancer NOS, and has two daughters with breast cancer.  The maternal grandparents died of reportedly non-cancer related issues.  The patient's father died at 70 as the result of a fall.  He had two brothers and a sister who reportedly are cancer free.  The paternal grandparents are deceased.  Miguel [name] is aware of previous family history of genetic testing for hereditary cancer risks. Patient's maternal ancestors are of [place] descent, and paternal ancestors are of [place] descent. There is no reported Ashkenazi Jewish ancestry. There is no known consanguinity.  GENETIC COUNSELING ASSESSMENT: Miguel [name] is a 64 y.o. male with a family history of breast cancer which is somewhat suggestive of a hereditary breast and ovarian cancer syndrome and predisposition to cancer given the number of women with breast cancer and ages of onset, as well as known mutation in multiple individuals. We, therefore, discussed and recommended the following at today's visit.   DISCUSSION: We discussed that 5 - 10% of breast cancer is hereditary, with most cases associated with BRCA mutations.  There are other genes that can be associated with hereditary breast cancer syndromes, however these would be unlikely since the individuals with breast cancer have a reported BRCA2 mutation that explains their diagnosis.  Based on his mother and sister's BRCA status, the patient has a 50% chance of also having this mutation.  We discussed that if he is positive then he will need to be followed more closely for prostate cancer and male breast cancer, as well as for melanoma and pancreatic cancer.  We would also recommend genetic testing for his daughter.  If he is negative, then he would not be at higher risk for BRCA related cancers, and his daughter would not be at risk for a BRCA2 mutation based on his family history.  The patient'  reported that there is another gene mutation that is coming from his daughter's mother's side of the family, but they are aware of this and his daughter's MD has those results.  We discussed that testing is beneficial for several reasons including knowing how to follow individuals and understand if other family members could be at risk for cancer and allow them to undergo genetic testing.   We reviewed the characteristics, features and inheritance patterns of hereditary cancer  syndromes. We also discussed genetic testing, including the appropriate family members to test, the process of testing, insurance coverage and turn-around-time for results. We discussed the implications of a negative, positive, carrier and/or variant of uncertain significant result. We recommended Miguel [name] pursue genetic testing for the common hereditary cancer gene panel. APC, ATM, AXIN2, BARD1, BMPR1A, BRCA1, BRCA2, BRIP1, CDH1, CDK4, CDKN2A, CHEK2, CTNNA1, DICER1, EPCAM, GREM1, HOXB13, KIT, MEN1, MLH1, MSH2, MSH3, MSH6, MUTYH, NBN, NF1, NTHL1, PALB2, PDGFRA, PMS2, POLD1, POLE, PTEN, RAD50, RAD51C, RAD51D, RNF43, SDHA, SDHB, SDHC, SDHD, SMAD4, SMARCA4. STK11, TP53, TSC1, TSC2, and VHL   Based on Miguel [name]'s family history of cancer, he meets medical criteria for genetic testing. Despite that he meets criteria, he may still have an out of pocket cost. We discussed that if his out of pocket cost for testing is over $100, the laboratory will call and confirm whether he wants to proceed with testing.  If the out of pocket cost of testing is less than $100 he will be billed by the genetic testing laboratory.   PLAN: After considering the risks, benefits, and limitations, Miguel [name] provided informed consent to pursue genetic testing and the blood sample was sent to [facility] for analysis of the common hereditary cancer panel. Results should be available within approximately 2-3 weeks' time, at which point they will  be disclosed by telephone to Miguel [name], as will any additional recommendations warranted by these results. Miguel [name] will receive a summary of his genetic counseling visit and a copy of his results once available. This information will also be available in Epic.   Lastly, we encouraged Miguel [name] to remain in contact with cancer genetics annually so that we can continuously update the family history and inform him of any changes in cancer genetics and testing that may be of benefit for this family.   Miguel [name]'s questions were answered to his satisfaction today. Our contact information was provided should additional questions or concerns arise. Thank you for the referral and allowing [place] to share in the care of your patient.   Miguel [name], [place], [facility] Licensed, Insurance risk surveyor [name] phone: [phone]  The patient was seen for a total of 35 minutes in face-to-face genetic counseling.  This patient was discussed with Drs. Magrinat, Lindi Adie and/or Burr Medico who agrees with the above.    _______________________________________________________________________ For Office Staff:  Number of people involved in session: 1 Was an Intern/ student involved with case: no

## 2019-12-20 ENCOUNTER — Encounter: Payer: Self-pay | Admitting: Genetic Counselor

## 2019-12-28 ENCOUNTER — Telehealth: Payer: Self-pay | Admitting: Genetic Counselor

## 2019-12-28 NOTE — Telephone Encounter (Signed)
Revealed that patient was negative for the reported familial BRCA2 mutation that is running in the family.  We will call as soon as the remainder of the testing is back.

## 2020-01-03 ENCOUNTER — Telehealth: Payer: Self-pay | Admitting: Genetic Counselor

## 2020-01-03 ENCOUNTER — Ambulatory Visit: Payer: Self-pay | Admitting: Genetic Counselor

## 2020-01-03 DIAGNOSIS — Z1379 Encounter for other screening for genetic and chromosomal anomalies: Secondary | ICD-10-CM | POA: Insufficient documentation

## 2020-01-03 NOTE — Telephone Encounter (Signed)
Revealed normal results on the common hereditary cancer panel.  Previous BRCA testing were normal indicating that patient is a true negative result.  Patient has a TSC2 VUS.  This will not change medical management.

## 2020-01-03 NOTE — Progress Notes (Signed)
HPI:  [name] was previously seen in the [facility] due to a family history of cancer and concerns regarding a hereditary predisposition to cancer. Please refer to our prior cancer genetics clinic note for more information regarding our discussion, assessment and recommendations, at the time. [name] recent genetic test results were disclosed to him, as were recommendations warranted by these results. These results and recommendations are discussed in more detail below.  CANCER HISTORY:  Oncology History   No history exists.    FAMILY HISTORY:  We obtained a detailed, 4-generation family history.  Significant diagnoses are listed below: Family History  Problem Relation Age of Onset  . Hyperlipidemia Mother   . Hypertension Mother   . Breast cancer Mother        dx in her 60s; BRCA2+  . Hyperlipidemia Father   . Hypertension Father   . Diabetes Sister        type 2  . Hyperlipidemia Sister   . Heart disease Sister   . Brain cancer Maternal Aunt        brain stem  . Prostate cancer Maternal Uncle   . Heart disease Paternal Uncle   . Heart attack Maternal Grandfather   . Breast cancer Sister 29       BRCA2+  . Breast cancer Cousin        mat first cousin  . Breast cancer Cousin        mat first cousin  . Breast cancer Other        MGM's sister  . Pancreatic cancer Maternal Great-grandmother   . Breast cancer Other        MGMs sister  . Breast cancer Cousin        mother's pat first cousin  . Breast cancer Cousin        pt's pat second cousin, once removed - BRCA pos  . Colon cancer Neg Hx   . Stomach cancer Neg Hx     The patient has an adopted son and biological daughter who are cancer free.  He has two sisters, one who had breast cancer at 43 and is BRCA2+.  This sister has a daughter who is also reportedly BRCA2 positive and was diagnosed with breast cancer at 74.  The patient's mother is living and his father is deceased.  The  patient's mother was diagnosed with breast cancer in her 5's and is reportedly BRCA2 positive.  She had two brothers and a sister.  One brother had a cancer NOS, and has two daughters with breast cancer.  The maternal grandparents died of reportedly non-cancer related issues.  The patient's father died at 77 as the result of a fall.  He had two brothers and a sister who reportedly are cancer free.  The paternal grandparents are deceased.  [name] is aware of previous family history of genetic testing for hereditary cancer risks. Patient's maternal ancestors are of [place] descent, and paternal ancestors are of [place] descent. There is no reported Ashkenazi Jewish ancestry. There is no known consanguinity.  GENETIC TEST RESULTS: We recommended [name] pursue testing for the familial hereditary cancer gene mutation by undergoing testing through the common hereditary cancer panel.  [name]'s test was normal and did not reveal the familial mutation. We call this result a true negative result because the cancer-causing mutation was identified in [name] family, and he did not inherit it.  Given this negative result, [name] chances of developing  BRCA-related cancers are the same as they are in the general population.  The test report has been scanned into EPIC and is located under the Molecular Pathology section of the Results Review tab.  A portion of the result report is included below for reference.     We discussed with [name] that because current genetic testing is not perfect, it is possible there may be a gene mutation in one of these genes that current testing cannot detect, but that chance is small.  We also discussed, that there could be another gene that has not yet been discovered, or that we have not yet tested, that is responsible for the cancer diagnoses in the family. It is also possible there is a hereditary cause for the cancer in the family  that [name] did not inherit and therefore was not identified in his testing.  Therefore, it is important to remain in touch with cancer genetics in the future so that we can continue to offer [name] the most up to date genetic testing.   Genetic testing did identify a variant of uncertain significance (VUS) was identified in the TSC2 gene called [id number].  At this time, it is unknown if this variant is associated with increased cancer risk or if this is a normal finding, but most variants such as this get reclassified to being inconsequential. It should not be used to make medical management decisions. With time, we suspect the lab will determine the significance of this variant, if any. If we do learn more about it, we will try to contact [name] to discuss it further. However, it is important to stay in touch with [place] periodically and keep the address and phone number up to date.  ADDITIONAL GENETIC TESTING: We discussed with [name] that there are other genes that are associated with increased cancer risk that can be analyzed. Should [name] wish to pursue additional genetic testing, we are happy to discuss and coordinate this testing, at any time.    CANCER SCREENING RECOMMENDATIONS: [name] test result is considered negative (normal).  While reassuring, this does not definitively rule out a hereditary predisposition to cancer. It is still possible that there could be genetic mutations that are undetectable by current technology. There could be genetic mutations in genes that have not been tested or identified to increase cancer risk.  Therefore, it is recommended he continue to follow the cancer management and screening guidelines provided by his primary healthcare provider.   An individual's cancer risk and medical management are not determined by genetic test results alone. Overall cancer risk assessment incorporates additional factors, including personal  medical history, family history, and any available genetic information that may result in a personalized plan for cancer prevention and surveillance  RECOMMENDATIONS FOR FAMILY MEMBERS:  Individuals in this family might be at some increased risk of developing cancer, over the general population risk, simply due to the family history of cancer.  We recommended women in this family have a yearly mammogram beginning at age 84, or 67 years younger than the earliest onset of cancer, an annual clinical breast exam, and perform monthly breast self-exams. Women in this family should also have a gynecological exam as recommended by their primary provider. All family members should be referred for colonoscopy starting at age 22.  FOLLOW-UP: Lastly, we discussed with [name] that cancer genetics is a rapidly advancing field and it is possible that new genetic tests will be appropriate for him  and/or his family members in the future. We encouraged him to remain in contact with cancer genetics on an annual basis so we can update his personal and family histories and let him know of advances in cancer genetics that may benefit this family.   Our contact number was provided. [name]'s questions were answered to his satisfaction, and he knows he is welcome to call us at anytime with additional questions or concerns.   Roma Kayser, [place], [facility] Licensed, Certified Genetic Counselor [email or web address]

## 2020-02-21 ENCOUNTER — Other Ambulatory Visit: Payer: Self-pay | Admitting: Family Medicine

## 2020-04-14 ENCOUNTER — Other Ambulatory Visit: Payer: Self-pay | Admitting: Family Medicine

## 2020-05-08 DIAGNOSIS — I10 Essential (primary) hypertension: Secondary | ICD-10-CM | POA: Diagnosis not present

## 2020-05-08 DIAGNOSIS — E669 Obesity, unspecified: Secondary | ICD-10-CM | POA: Diagnosis not present

## 2020-05-08 DIAGNOSIS — Z833 Family history of diabetes mellitus: Secondary | ICD-10-CM | POA: Diagnosis not present

## 2020-05-08 DIAGNOSIS — K219 Gastro-esophageal reflux disease without esophagitis: Secondary | ICD-10-CM | POA: Diagnosis not present

## 2020-05-08 DIAGNOSIS — Z803 Family history of malignant neoplasm of breast: Secondary | ICD-10-CM | POA: Diagnosis not present

## 2020-05-08 DIAGNOSIS — Z8249 Family history of ischemic heart disease and other diseases of the circulatory system: Secondary | ICD-10-CM | POA: Diagnosis not present

## 2020-05-20 ENCOUNTER — Other Ambulatory Visit: Payer: Self-pay | Admitting: Family Medicine

## 2020-08-14 ENCOUNTER — Other Ambulatory Visit: Payer: Self-pay | Admitting: Family Medicine

## 2020-09-29 ENCOUNTER — Encounter: Payer: Self-pay | Admitting: Family Medicine

## 2020-09-29 ENCOUNTER — Ambulatory Visit (INDEPENDENT_AMBULATORY_CARE_PROVIDER_SITE_OTHER): Payer: Medicare HMO | Admitting: Family Medicine

## 2020-09-29 ENCOUNTER — Other Ambulatory Visit: Payer: Self-pay

## 2020-09-29 VITALS — BP 120/58 | HR 78 | Temp 98.4°F | Ht 67.0 in | Wt 208.0 lb

## 2020-09-29 DIAGNOSIS — Z91038 Other insect allergy status: Secondary | ICD-10-CM | POA: Diagnosis not present

## 2020-09-29 NOTE — Patient Instructions (Signed)
Continue with the anti-histamine -topical and oral   Watch for any fever, chills, or progressive redness or any associated pain.

## 2020-09-29 NOTE — Progress Notes (Signed)
Established Patient Office Visit  Subjective:  Patient ID: Miguel [name], male    DOB: 1955/08/18  Age: 65 y.o. MRN: [id number]  CC:  Chief Complaint  Patient presents with   Insect Bite    Patient complains of an insect bite right upper arm x11 days ago, redness and increasing size x1 day    HPI Miguel [name] presents for redness and some swelling right lateral arm.  He states about 9 to 10 days ago he was working in the yard trimming some bushes and felt a stinging sensation right lateral arm.  He had a very small blister in the center.  This eventually resolved in terms of some itching and mild swelling and he assumed this was some sort of insect bite.  Then yesterday he noticed some increased redness especially below the site of the bite- which has persisted.  No fevers or chills.  Itch greater than pain.  He has tried some topical Benadryl and oral antihistamine with Benadryl and symptoms seem slightly improved today.  No recent pustules.  Past Medical History:  Diagnosis Date   Chicken pox    Family history of breast cancer    GERD (gastroesophageal reflux disease)    Hypertension     Past Surgical History:  Procedure Laterality Date   MANDIBLE SURGERY  1980   nose repair  1970   deviated septum   REPAIR ANKLE LIGAMENT  1975   REPAIR KNEE LIGAMENT  1980    accident   TONSILLECTOMY      Family History  Problem Relation Age of Onset   Hyperlipidemia Mother    Hypertension Mother    Breast cancer Mother        dx in her 59s; BRCA2+   Hyperlipidemia Father    Hypertension Father    Diabetes Sister        type 2   Hyperlipidemia Sister    Heart disease Sister    Brain cancer Maternal Aunt        brain stem   Prostate cancer Maternal Uncle    Heart disease Paternal Uncle    Heart attack Maternal Grandfather    Breast cancer Sister 67       BRCA2+   Breast cancer Cousin        mat first cousin   Breast cancer Cousin        mat first cousin   Breast  cancer Other        MGM's sister   Pancreatic cancer Maternal Great-grandmother    Breast cancer Other        MGMs sister   Breast cancer Cousin        mother's pat first cousin   Breast cancer Cousin        pt's pat second cousin, once removed - BRCA pos   Colon cancer Neg Hx    Stomach cancer Neg Hx     Social History   Socioeconomic History   Marital status: Married    Spouse name: Not on file   Number of children: Not on file   Years of education: Not on file   Highest education level: Not on file  Occupational History   Not on file  Tobacco Use   Smoking status: Never   Smokeless tobacco: Never  Vaping Use   Vaping Use: Never used  Substance and Sexual Activity   Alcohol use: No   Drug use: No   Sexual activity: Not on file  Other Topics  Concern   Not on file  Social History Narrative   Not on file   Social Determinants of Health   Financial Resource Strain: Not on file  Food Insecurity: Not on file  Transportation Needs: Not on file  Physical Activity: Not on file  Stress: Not on file  Social Connections: Not on file  Intimate Partner Violence: Not on file    Outpatient Medications Prior to Visit  Medication Sig Dispense Refill   amLODipine (NORVASC) 5 MG tablet TAKE 1 TABLET BY MOUTH EVERY DAY 90 tablet 2   aspirin EC 81 MG tablet Take 81 mg by mouth daily.     losartan (COZAAR) 100 MG tablet TAKE 1 TABLET BY MOUTH EVERY DAY 90 tablet 0   omeprazole (PRILOSEC) 20 MG capsule Take 20 mg by mouth daily.     No facility-administered medications prior to visit.    No Known Allergies  ROS Review of Systems  Constitutional:  Negative for chills and fever.  Hematological:  Negative for adenopathy.     Objective:    Physical Exam Skin:    Comments: 10 x 12 centimeter area of faint erythema right upper lateral arm.  Nontender.  Slight warmth.  Punctate area near the upper portion.  No pustules.  No foreign bodies noted. Non-tender.    BP (!)  120/58 (BP Location: Left Arm, Patient Position: Sitting, Cuff Size: Normal)   Pulse 78   Temp 98.4 F (36.9 C) (Rectal)   Ht _0  (1.702 m)   Wt 208 lb (94.3 kg)   SpO2 98%   BMI 32.58 kg/m  Wt Readings from Last 3 Encounters:  09/29/20 208 lb (94.3 kg)  12/05/19 213 lb 6.4 oz (96.8 kg)  06/04/19 221 lb 11.2 oz (100.6 kg)     Health Maintenance Due  Topic Date Due   COVID-19 Vaccine (1) Never done   PNA vac Low Risk Adult (1 of 2 - PCV13) Never done   INFLUENZA VACCINE  09/29/2020    There are no preventive care reminders to display for this patient.  Lab Results  Component Value Date   TSH 2.71 06/04/2019   Lab Results  Component Value Date   WBC 7.9 06/04/2019   HGB 13.9 06/04/2019   HCT 41.8 06/04/2019   MCV 86.3 06/04/2019   PLT 376.0 06/04/2019   Lab Results  Component Value Date   NA 138 06/04/2019   K 4.4 06/04/2019   CO2 28 06/04/2019   GLUCOSE 111 (H) 06/04/2019   BUN 15 06/04/2019   CREATININE 1.05 06/04/2019   BILITOT 0.6 06/04/2019   ALKPHOS 79 06/04/2019   AST 24 06/04/2019   ALT 29 06/04/2019   PROT 7.5 06/04/2019   ALBUMIN 4.5 06/04/2019   CALCIUM 9.7 06/04/2019   GFR 71.08 06/04/2019   Lab Results  Component Value Date   CHOL 139 06/04/2019   Lab Results  Component Value Date   HDL 29.90 (L) 06/04/2019   Lab Results  Component Value Date   LDLCALC 76 06/04/2019   Lab Results  Component Value Date   TRIG 166.0 (H) 06/04/2019   Lab Results  Component Value Date   CHOLHDL 5 06/04/2019   Lab Results  Component Value Date   HGBA1C 5.8 (A) 12/05/2019      Assessment & Plan:   Erythema and swelling right lateral arm.  History of possible insect bite about 10 days ago.  Perplexing is the fact that this seemed to be stable and resolving  until yesterday when he had some recurrent erythema.  Doubt cellulitis.  This looks more like a local allergic reaction.  -Continue with over-the-counter antihistamine and topical  Benadryl. -Watch closely for any fevers or chills or any progressive erythema -discussed possible steroids but he wants to stick with the anti-histamines first which seems reasonable.      Follow-up: No follow-ups on file.    Carolann Littler, MD

## 2020-11-10 ENCOUNTER — Other Ambulatory Visit: Payer: Self-pay | Admitting: Family Medicine

## 2021-02-03 DIAGNOSIS — H2513 Age-related nuclear cataract, bilateral: Secondary | ICD-10-CM | POA: Diagnosis not present

## 2021-02-07 DIAGNOSIS — Z0100 Encounter for examination of eyes and vision without abnormal findings: Secondary | ICD-10-CM | POA: Diagnosis not present

## 2021-02-15 ENCOUNTER — Other Ambulatory Visit: Payer: Self-pay | Admitting: Family Medicine

## 2021-02-16 ENCOUNTER — Other Ambulatory Visit: Payer: Self-pay | Admitting: Family Medicine

## 2021-04-17 ENCOUNTER — Ambulatory Visit (INDEPENDENT_AMBULATORY_CARE_PROVIDER_SITE_OTHER): Payer: Medicare HMO | Admitting: Family Medicine

## 2021-04-17 ENCOUNTER — Encounter: Payer: Self-pay | Admitting: Family Medicine

## 2021-04-17 VITALS — BP 130/70 | HR 80 | Temp 98.1°F | Ht 67.0 in | Wt 214.0 lb

## 2021-04-17 DIAGNOSIS — Z1211 Encounter for screening for malignant neoplasm of colon: Secondary | ICD-10-CM

## 2021-04-17 DIAGNOSIS — Z0000 Encounter for general adult medical examination without abnormal findings: Secondary | ICD-10-CM | POA: Diagnosis not present

## 2021-04-17 DIAGNOSIS — R7303 Prediabetes: Secondary | ICD-10-CM | POA: Diagnosis not present

## 2021-04-17 DIAGNOSIS — Z23 Encounter for immunization: Secondary | ICD-10-CM | POA: Diagnosis not present

## 2021-04-17 LAB — LIPID PANEL
Cholesterol: 154 mg/dL (ref 0–200)
HDL: 33.1 mg/dL — ABNORMAL LOW (ref >39.00)
LDL Cholesterol: 86 mg/dL (ref 0–99)
NonHDL: 121.36
Total CHOL/HDL Ratio: 5
Triglycerides: 177 mg/dL — ABNORMAL HIGH (ref 0.0–149.0)
VLDL: 35.4 mg/dL (ref 0.0–40.0)

## 2021-04-17 LAB — PSA: PSA: 1.12 ng/mL (ref 0.10–4.00)

## 2021-04-17 LAB — HEPATIC FUNCTION PANEL
ALT: 28 U/L (ref 0–53)
AST: 23 U/L (ref 0–37)
Albumin: 4.6 g/dL (ref 3.5–5.2)
Alkaline Phosphatase: 79 U/L (ref 39–117)
Bilirubin, Direct: 0.1 mg/dL (ref 0.0–0.3)
Total Bilirubin: 0.4 mg/dL (ref 0.2–1.2)
Total Protein: 7.5 g/dL (ref 6.0–8.3)

## 2021-04-17 LAB — BASIC METABOLIC PANEL
BUN: 13 mg/dL (ref 6–23)
CO2: 32 mEq/L (ref 19–32)
Calcium: 9.9 mg/dL (ref 8.4–10.5)
Chloride: 103 mEq/L (ref 96–112)
Creatinine, Ser: 1 mg/dL (ref 0.40–1.50)
GFR: 78.71 mL/min (ref >60.00)
Glucose, Bld: 113 mg/dL — ABNORMAL HIGH (ref 70–99)
Potassium: 4.5 mEq/L (ref 3.5–5.1)
Sodium: 139 mEq/L (ref 135–145)

## 2021-04-17 LAB — HEMOGLOBIN A1C: Hgb A1c MFr Bld: 6.3 % (ref 4.6–6.5)

## 2021-04-17 LAB — CBC WITH DIFFERENTIAL/PLATELET
Basophils Absolute: 0.1 10*3/uL (ref 0.0–0.1)
Basophils Relative: 2 % (ref 0.0–3.0)
Eosinophils Absolute: 0.2 10*3/uL (ref 0.0–0.7)
Eosinophils Relative: 3.7 % (ref 0.0–5.0)
HCT: 41.9 % (ref 39.0–52.0)
Hemoglobin: 13.4 g/dL (ref 13.0–17.0)
Lymphocytes Relative: 29 % (ref 12.0–46.0)
Lymphs Abs: 1.8 10*3/uL (ref 0.7–4.0)
MCHC: 31.9 g/dL (ref 30.0–36.0)
MCV: 81.7 fl (ref 78.0–100.0)
Monocytes Absolute: 0.8 10*3/uL (ref 0.1–1.0)
Monocytes Relative: 12.1 % — ABNORMAL HIGH (ref 3.0–12.0)
Neutro Abs: 3.3 10*3/uL (ref 1.4–7.7)
Neutrophils Relative %: 53.2 % (ref 43.0–77.0)
Platelets: 361 10*3/uL (ref 150.0–400.0)
RBC: 5.14 Mil/uL (ref 4.22–5.81)
RDW: 15 % (ref 11.5–15.5)
WBC: 6.2 10*3/uL (ref 4.0–10.5)

## 2021-04-17 LAB — TSH: TSH: 2.28 u[IU]/mL (ref 0.35–5.50)

## 2021-04-17 NOTE — Progress Notes (Signed)
Established Patient Office Visit  Subjective:  Patient ID: Miguel [name], male    DOB: 1955-07-10  Age: 66 y.o. MRN: [id number]  CC:  Chief Complaint  Patient presents with   Annual Exam    HPI Miguel [name] presents for annual physical exam.  Generally doing well.  He has history of hypertension treated with amlodipine and losartan.  Did not take medications this morning.  Blood pressure generally well controlled.  No specific complaints today.  Health maintenance reviewed.  Due for repeat colon cancer screening.  He had colonoscopy 10 years ago with no polyps.  No history of pneumonia vaccine.  No shingles vaccine.  He has had shingles previously.  Declines flu vaccine  Family history reviewed---father died in his 55D of complications of head injury.  He had hypertension hyperlipidemia.  Mother is alive age 22.  She has hypertension hyperlipidemia and history of breast cancer.  Sister with type 2 diabetes as well as hyperlipidemia He has another sister BRCA2 positive  Social history-married.  He has 58 year old daughter and a 45 year old son.  His son attends [employer].  Daughter is getting married next year.  Never smoked.     Past Medical History:  Diagnosis Date   Chicken pox    Family history of breast cancer    GERD (gastroesophageal reflux disease)    Hypertension     Past Surgical History:  Procedure Laterality Date   MANDIBLE SURGERY  1980   nose repair  1970   deviated septum   REPAIR ANKLE LIGAMENT  1975   REPAIR KNEE LIGAMENT  1980    accident   TONSILLECTOMY      Family History  Problem Relation Age of Onset   Hyperlipidemia Mother    Hypertension Mother    Breast cancer Mother        dx in her 67s; BRCA2+   Hyperlipidemia Father    Hypertension Father    Diabetes Sister        type 2   Hyperlipidemia Sister    Heart disease Sister    Brain cancer Maternal Aunt        brain stem   Prostate cancer Maternal Uncle    Heart disease  Paternal Uncle    Heart attack Maternal Grandfather    Breast cancer Sister 43       BRCA2+   Breast cancer Cousin        mat first cousin   Breast cancer Cousin        mat first cousin   Breast cancer Other        MGM's sister   Pancreatic cancer Maternal Great-grandmother    Breast cancer Other        MGMs sister   Breast cancer Cousin        mother's pat first cousin   Breast cancer Cousin        pt's pat second cousin, once removed - BRCA pos   Colon cancer Neg Hx    Stomach cancer Neg Hx     Social History   Socioeconomic History   Marital status: Married    Spouse name: Not on file   Number of children: Not on file   Years of education: Not on file   Highest education level: Not on file  Occupational History   Not on file  Tobacco Use   Smoking status: Never   Smokeless tobacco: Never  Vaping Use   Vaping Use: Never used  Substance and  Sexual Activity   Alcohol use: No   Drug use: No   Sexual activity: Not on file  Other Topics Concern   Not on file  Social History Narrative   Not on file   Social Determinants of Health   Financial Resource Strain: Not on file  Food Insecurity: Not on file  Transportation Needs: Not on file  Physical Activity: Not on file  Stress: Not on file  Social Connections: Not on file  Intimate Partner Violence: Not on file    Outpatient Medications Prior to Visit  Medication Sig Dispense Refill   amLODipine (NORVASC) 5 MG tablet Take 1 tablet (5 mg total) by mouth daily. *appointment needed for future refills 90 tablet 0   aspirin EC 81 MG tablet Take 81 mg by mouth daily.     losartan (COZAAR) 100 MG tablet TAKE 1 TABLET BY MOUTH EVERY DAY 90 tablet 0   omeprazole (PRILOSEC) 20 MG capsule Take 20 mg by mouth daily.     No facility-administered medications prior to visit.    No Known Allergies  ROS Review of Systems  Constitutional:  Negative for activity change, appetite change, fatigue and fever.  HENT:  Negative  for congestion, ear pain and trouble swallowing.   Eyes:  Negative for pain and visual disturbance.  Respiratory:  Negative for cough, shortness of breath and wheezing.   Cardiovascular:  Negative for chest pain and palpitations.  Gastrointestinal:  Negative for abdominal distention, abdominal pain, blood in stool, constipation, diarrhea, nausea, rectal pain and vomiting.  Endocrine: Negative for polydipsia and polyuria.  Genitourinary:  Negative for dysuria, hematuria and testicular pain.  Musculoskeletal:  Negative for arthralgias and joint swelling.  Skin:  Negative for rash.  Neurological:  Negative for dizziness, syncope and headaches.  Hematological:  Negative for adenopathy.  Psychiatric/Behavioral:  Negative for confusion and dysphoric mood.      Objective:    Physical Exam Constitutional:      General: He is not in acute distress.    Appearance: He is well-developed.  HENT:     Head: Normocephalic and atraumatic.     Right Ear: External ear normal.     Left Ear: External ear normal.  Eyes:     Conjunctiva/sclera: Conjunctivae normal.     Pupils: Pupils are equal, round, and reactive to light.  Neck:     Thyroid: No thyromegaly.  Cardiovascular:     Rate and Rhythm: Normal rate and regular rhythm.     Heart sounds: Normal heart sounds. No murmur heard. Pulmonary:     Effort: No respiratory distress.     Breath sounds: No wheezing or rales.  Abdominal:     General: Bowel sounds are normal. There is no distension.     Palpations: Abdomen is soft. There is no mass.     Tenderness: There is no abdominal tenderness. There is no guarding or rebound.  Musculoskeletal:     Cervical back: Normal range of motion and neck supple.     Right lower leg: No edema.     Left lower leg: No edema.  Lymphadenopathy:     Cervical: No cervical adenopathy.  Skin:    Findings: No rash.     Comments: Multiple seborrheic keratoses on his back.  Hypertrophic scar right elbow.  No  concerning lesions noted.  Neurological:     Mental Status: He is alert and oriented to person, place, and time.     Cranial Nerves: No cranial nerve deficit.  Deep Tendon Reflexes: Reflexes normal.    BP 130/70 (BP Location: Left Arm, Patient Position: Sitting, Cuff Size: Normal)    Pulse 80    Temp 98.1 F (36.7 C) (Oral)    Ht 5' 7" (1.702 m)    Wt 214 lb (97.1 kg)    SpO2 96%    BMI 33.52 kg/m  Wt Readings from Last 3 Encounters:  04/17/21 214 lb (97.1 kg)  09/29/20 208 lb (94.3 kg)  12/05/19 213 lb 6.4 oz (96.8 kg)     Health Maintenance Due  Topic Date Due   COVID-19 Vaccine (1) Never done   Zoster Vaccines- Shingrix (1 of 2) Never done   Pneumonia Vaccine 42+ Years old (1 - PCV) Never done    There are no preventive care reminders to display for this patient.  Lab Results  Component Value Date   TSH 2.71 06/04/2019   Lab Results  Component Value Date   WBC 7.9 06/04/2019   HGB 13.9 06/04/2019   HCT 41.8 06/04/2019   MCV 86.3 06/04/2019   PLT 376.0 06/04/2019   Lab Results  Component Value Date   NA 138 06/04/2019   K 4.4 06/04/2019   CO2 28 06/04/2019   GLUCOSE 111 (H) 06/04/2019   BUN 15 06/04/2019   CREATININE 1.05 06/04/2019   BILITOT 0.6 06/04/2019   ALKPHOS 79 06/04/2019   AST 24 06/04/2019   ALT 29 06/04/2019   PROT 7.5 06/04/2019   ALBUMIN 4.5 06/04/2019   CALCIUM 9.7 06/04/2019   GFR 71.08 06/04/2019   Lab Results  Component Value Date   CHOL 139 06/04/2019   Lab Results  Component Value Date   HDL 29.90 (L) 06/04/2019   Lab Results  Component Value Date   LDLCALC 76 06/04/2019   Lab Results  Component Value Date   TRIG 166.0 (H) 06/04/2019   Lab Results  Component Value Date   CHOLHDL 5 06/04/2019   Lab Results  Component Value Date   HGBA1C 5.8 (A) 12/05/2019      Assessment & Plan:   Problem List Items Addressed This Visit   None Visit Diagnoses     Physical exam    -  Primary   Relevant Orders   Basic  metabolic panel   Lipid panel   CBC with Differential/Platelet   TSH   Hepatic function panel   PSA   Screening for colon cancer       Relevant Orders   Cologuard   Prediabetes       Relevant Orders   Hemoglobin A1c     -Recommend Prevnar 20 and patient consents  -Recommend repeat colon cancer screening.  He prefers Cologuard and is a candidate.  This will be ordered.  We discussed limitations of Cologuard including specificity of 90% and sensitivity of roughly 92%.  -Discussed Shingrix vaccine and he will check on insurance coverage  Obtain screening labs.  Include A1c with family history of diabetes and prediabetes range blood sugars previously.  No orders of the defined types were placed in this encounter.   Follow-up: No follow-ups on file.    Carolann Littler, MD

## 2021-04-27 DIAGNOSIS — Z1211 Encounter for screening for malignant neoplasm of colon: Secondary | ICD-10-CM | POA: Diagnosis not present

## 2021-05-03 LAB — COLOGUARD: COLOGUARD: NEGATIVE

## 2021-05-19 ENCOUNTER — Other Ambulatory Visit: Payer: Self-pay | Admitting: Family Medicine

## 2021-05-24 ENCOUNTER — Other Ambulatory Visit: Payer: Self-pay | Admitting: Family Medicine

## 2021-06-09 ENCOUNTER — Telehealth: Payer: Self-pay | Admitting: Family Medicine

## 2021-06-09 NOTE — Telephone Encounter (Signed)
Left message for patient to call back and schedule Medicare Annual Wellness Visit (AWV) either virtually or in office. Left  my jabber number [phone] ? ? ?awvi 04/01/21 per [facility] ? ; please schedule at anytime with [facility] Nurse Health Advisor 1 or 2 ? ? ?This should be a 45 minute visit.  ?

## 2021-06-15 ENCOUNTER — Ambulatory Visit (INDEPENDENT_AMBULATORY_CARE_PROVIDER_SITE_OTHER): Payer: Medicare HMO

## 2021-06-15 ENCOUNTER — Ambulatory Visit (INDEPENDENT_AMBULATORY_CARE_PROVIDER_SITE_OTHER): Payer: Medicare HMO | Admitting: Family Medicine

## 2021-06-15 ENCOUNTER — Encounter: Payer: Self-pay | Admitting: Family Medicine

## 2021-06-15 VITALS — BP 136/70 | HR 75 | Temp 96.5°F | Ht 67.0 in | Wt 208.5 lb

## 2021-06-15 DIAGNOSIS — R053 Chronic cough: Secondary | ICD-10-CM

## 2021-06-15 DIAGNOSIS — U071 COVID-19: Secondary | ICD-10-CM | POA: Diagnosis not present

## 2021-06-15 DIAGNOSIS — R9389 Abnormal findings on diagnostic imaging of other specified body structures: Secondary | ICD-10-CM

## 2021-06-15 DIAGNOSIS — R059 Cough, unspecified: Secondary | ICD-10-CM | POA: Diagnosis not present

## 2021-06-15 DIAGNOSIS — J9811 Atelectasis: Secondary | ICD-10-CM | POA: Diagnosis not present

## 2021-06-15 MED ORDER — BENZONATATE 100 MG PO CAPS: ORAL_CAPSULE | ORAL | 0 refills | Status: DC
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
## 2021-06-16 ENCOUNTER — Telehealth: Payer: Self-pay | Admitting: *Deleted

## 2021-06-16 NOTE — Telephone Encounter (Signed)
Ruby called from [facility] [phone]) to bring attention to the report noted in the chart for the chest x-ray performed on 4/17.  PCP informed verbally and message sent via Epic. ?

## 2021-06-16 NOTE — Progress Notes (Unsigned)
Abnormal CXR- pt notified.  ?calcified thoracic aortic aneurysm.    ?

## 2021-06-17 NOTE — Telephone Encounter (Signed)
Noted  

## 2021-06-18 ENCOUNTER — Telehealth: Payer: Self-pay | Admitting: Family Medicine

## 2021-06-18 NOTE — Telephone Encounter (Signed)
Left a message for Miguel [name] to return my call in order to fax over CXR report ?

## 2021-06-18 NOTE — Telephone Encounter (Signed)
I have not received PA forms at this time.  ?

## 2021-06-18 NOTE — Telephone Encounter (Signed)
Taron from [employer] call and stated she need a  prior Authorization for pt by 12:00 tomorrow or they will cancel his appt .Taron # is [phone]. ?

## 2021-06-18 NOTE — Telephone Encounter (Signed)
I spoke with [place] with [employer] and a new claim has been initiated for Ct chest W contrast. Previous X-ray notes have been faxed to [facility] for decision regarding CT. Case number- [id number] ?

## 2021-06-22 ENCOUNTER — Inpatient Hospital Stay: Admission: RE | Admit: 2021-06-22 | Payer: Medicare HMO | Source: Ambulatory Visit

## 2021-06-30 ENCOUNTER — Ambulatory Visit
Admission: RE | Admit: 2021-06-30 | Discharge: 2021-06-30 | Disposition: A | Discharge status: Home / Self Care | Payer: Medicare HMO | Source: Ambulatory Visit | Attending: Family Medicine | Admitting: Family Medicine

## 2021-06-30 DIAGNOSIS — I712 Thoracic aortic aneurysm, without rupture, unspecified: Secondary | ICD-10-CM | POA: Diagnosis not present

## 2021-06-30 DIAGNOSIS — R9389 Abnormal findings on diagnostic imaging of other specified body structures: Secondary | ICD-10-CM

## 2021-06-30 DIAGNOSIS — R918 Other nonspecific abnormal finding of lung field: Secondary | ICD-10-CM | POA: Diagnosis not present

## 2021-06-30 DIAGNOSIS — I2510 Atherosclerotic heart disease of native coronary artery without angina pectoris: Secondary | ICD-10-CM | POA: Diagnosis not present

## 2021-06-30 MED ORDER — IOPAMIDOL (ISOVUE-300) INJECTION 61%
75.0000 mL | Freq: Once | INTRAVENOUS | Status: AC | PRN
  Administered 2021-06-30: 75 mL via INTRAVENOUS

## 2021-07-01 ENCOUNTER — Other Ambulatory Visit: Payer: Self-pay | Admitting: Family Medicine

## 2021-07-01 DIAGNOSIS — I7122 Aneurysm of the aortic arch, without rupture: Secondary | ICD-10-CM

## 2021-07-01 NOTE — Progress Notes (Signed)
Patient had recent chest x-ray for several month history of cough.  Calcification noted in region of aortic arch.  CT chest confirmed a 4 cm aneurysm aortic isthmus.  Patient's cough is slightly improved.  He did have incidental finding of multiple small pulmonary nodules less than 1 cm which are calcified granulomas likely.  He is a non-smoker.  Also had three-vessel coronary calcification.  Setting up CVTS referral.  Discussed further restratification with possible coronary calcium scan and he declines at this time.  We will discuss further at follow-up. ?

## 2021-07-06 ENCOUNTER — Telehealth: Payer: Self-pay | Admitting: Family Medicine

## 2021-07-06 NOTE — Telephone Encounter (Signed)
Spoke with patient to schedule Medicare Annual Wellness Visit (AWV) either virtually or in office.  ? ?He would like a call back end of June ? ?awvi 04/01/21 per [place] please schedule at anytime with [facility] Nurse Health Advisor 1 or 2 ? ? ?This should be a 45 minute visit.  ?

## 2021-08-17 ENCOUNTER — Other Ambulatory Visit: Payer: Self-pay | Admitting: Family Medicine

## 2021-08-17 DIAGNOSIS — I10 Essential (primary) hypertension: Secondary | ICD-10-CM | POA: Diagnosis not present

## 2021-08-17 DIAGNOSIS — Z6830 Body mass index (BMI) 30.0-30.9, adult: Secondary | ICD-10-CM | POA: Diagnosis not present

## 2021-08-17 DIAGNOSIS — Z008 Encounter for other general examination: Secondary | ICD-10-CM | POA: Diagnosis not present

## 2021-08-17 DIAGNOSIS — E669 Obesity, unspecified: Secondary | ICD-10-CM | POA: Diagnosis not present

## 2021-08-17 DIAGNOSIS — Z8249 Family history of ischemic heart disease and other diseases of the circulatory system: Secondary | ICD-10-CM | POA: Diagnosis not present

## 2021-08-17 DIAGNOSIS — K219 Gastro-esophageal reflux disease without esophagitis: Secondary | ICD-10-CM | POA: Diagnosis not present

## 2021-08-17 DIAGNOSIS — Z809 Family history of malignant neoplasm, unspecified: Secondary | ICD-10-CM | POA: Diagnosis not present

## 2021-08-17 DIAGNOSIS — Z791 Long term (current) use of non-steroidal anti-inflammatories (NSAID): Secondary | ICD-10-CM | POA: Diagnosis not present

## 2021-08-18 ENCOUNTER — Institutional Professional Consult (permissible substitution): Payer: Medicare HMO | Admitting: Thoracic Surgery (Cardiothoracic Vascular Surgery)

## 2021-08-18 ENCOUNTER — Telehealth: Payer: Self-pay | Admitting: Family Medicine

## 2021-08-18 VITALS — BP 143/83 | HR 73 | Resp 20 | Ht 67.0 in | Wt 210.0 lb

## 2021-08-18 DIAGNOSIS — I7121 Aneurysm of the ascending aorta, without rupture: Secondary | ICD-10-CM

## 2021-08-18 DIAGNOSIS — R918 Other nonspecific abnormal finding of lung field: Secondary | ICD-10-CM | POA: Diagnosis not present

## 2021-08-18 DIAGNOSIS — I2510 Atherosclerotic heart disease of native coronary artery without angina pectoris: Secondary | ICD-10-CM

## 2021-08-18 NOTE — Telephone Encounter (Signed)
I spoke with the pt and he was seen by Dr. Charlett Lango Cardiothoracic and he advised the pt be referred to Cardiology for Coronary calcification.

## 2021-08-18 NOTE — Telephone Encounter (Signed)
Pt requesting a referral to cardiologist based on visit with another provider
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
## 2021-08-19 NOTE — Telephone Encounter (Signed)
Referral placed and pt informed

## 2021-08-24 ENCOUNTER — Telehealth: Payer: Self-pay | Admitting: Family Medicine

## 2021-08-24 NOTE — Telephone Encounter (Signed)
Left message for patient to call back and schedule Medicare Annual Wellness Visit (AWV) either virtually or in office. Left  my jabber number [phone] ? ? ?awvi 04/01/21 per [facility] ? ; please schedule at anytime with [facility] Nurse Health Advisor 1 or 2 ? ? ?This should be a 45 minute visit.  ?

## 2021-08-24 NOTE — Telephone Encounter (Signed)
Pt called to inquire about his referral / authorization to see the Cardiologist.  Pt would like a call back today. Please call [phone] Pt stated it is alright to leave a voice message.  Please advise.

## 2021-08-25 ENCOUNTER — Ambulatory Visit (HOSPITAL_BASED_OUTPATIENT_CLINIC_OR_DEPARTMENT_OTHER): Payer: Medicare HMO | Admitting: Cardiology

## 2021-08-25 VITALS — BP 134/72 | HR 75 | Ht 67.0 in | Wt 209.5 lb

## 2021-08-25 DIAGNOSIS — E785 Hyperlipidemia, unspecified: Secondary | ICD-10-CM | POA: Diagnosis not present

## 2021-08-25 DIAGNOSIS — I2510 Atherosclerotic heart disease of native coronary artery without angina pectoris: Secondary | ICD-10-CM

## 2021-08-25 DIAGNOSIS — I10 Essential (primary) hypertension: Secondary | ICD-10-CM | POA: Diagnosis not present

## 2021-08-25 DIAGNOSIS — Z01812 Encounter for preprocedural laboratory examination: Secondary | ICD-10-CM | POA: Diagnosis not present

## 2021-08-25 DIAGNOSIS — I719 Aortic aneurysm of unspecified site, without rupture: Secondary | ICD-10-CM | POA: Diagnosis not present

## 2021-08-25 DIAGNOSIS — R079 Chest pain, unspecified: Secondary | ICD-10-CM

## 2021-08-25 MED ORDER — ATORVASTATIN CALCIUM 20 MG PO TABS: 20.0000 mg | ORAL_TABLET | Freq: Every day | ORAL | 3 refills | Status: DC

## 2021-08-25 MED ORDER — NITROGLYCERIN 0.4 MG SL SUBL: 0.4000 mg | SUBLINGUAL_TABLET | SUBLINGUAL | 3 refills | Status: DC | PRN

## 2021-08-25 MED ORDER — METOPROLOL TARTRATE 100 MG PO TABS: ORAL_TABLET | ORAL | 0 refills | Status: DC
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
## 2021-08-26 ENCOUNTER — Encounter: Payer: Self-pay | Admitting: Gastroenterology

## 2021-08-26 LAB — BASIC METABOLIC PANEL
BUN/Creatinine Ratio: 14 (ref 10–24)
BUN: 15 mg/dL (ref 8–27)
CO2: 22 mmol/L (ref 20–29)
Calcium: 9.8 mg/dL (ref 8.6–10.2)
Chloride: 103 mmol/L (ref 96–106)
Creatinine, Ser: 1.08 mg/dL (ref 0.76–1.27)
Glucose: 89 mg/dL (ref 70–99)
Potassium: 4.5 mmol/L (ref 3.5–5.2)
Sodium: 140 mmol/L (ref 134–144)
eGFR: 76 mL/min/{1.73_m2} (ref >59)

## 2021-08-27 ENCOUNTER — Ambulatory Visit (INDEPENDENT_AMBULATORY_CARE_PROVIDER_SITE_OTHER): Payer: Medicare HMO

## 2021-08-27 VITALS — Ht 70.0 in | Wt 210.0 lb

## 2021-08-27 DIAGNOSIS — Z0000 Encounter for general adult medical examination without abnormal findings: Secondary | ICD-10-CM | POA: Diagnosis not present

## 2021-08-27 NOTE — Progress Notes (Signed)
Subjective:   Miguel [name] is a 66 y.o. male who presents for Medicare Annual/Subsequent preventive examination.  Review of Systems    Virtual Visit via Telephone Note  I connected with  Miguel [name] on 08/27/21 at  8:45 AM EDT by telephone and verified that I am speaking with the correct person using two identifiers.  Location: Patient: Home Provider: Office Persons participating in the virtual visit: patient/Nurse Health Advisor   I discussed the limitations, risks, security and privacy concerns of performing an evaluation and management service by telephone and the availability of in person appointments. The patient expressed understanding and agreed to proceed.  Interactive audio and video telecommunications were attempted between this nurse and patient, however failed, due to patient having technical difficulties OR patient did not have access to video capability.  We continued and completed visit with audio only.  Some vital signs may be absent or patient reported.   Miguel Peaches, LPN  Cardiac Risk Factors include: advanced age (>36mn, >>36women);hypertension;male gender     Objective:    Today's Vitals   08/27/21 0850  Weight: 210 lb (95.3 kg)  Height: 5' 10"  (1.778 m)   Body mass index is 30.13 kg/m.     08/27/2021    8:58 AM  Advanced Directives  Does Patient Have a Medical Advance Directive? Yes  Type of [place]  Does patient want to make changes to medical advance directive? No - Patient declined  Copy of [place] Chart? No - copy requested    Current Medications (verified) Outpatient Encounter Medications as of 08/27/2021  Medication Sig   amLODipine (NORVASC) 5 MG tablet TAKE 1 TABLET (5 MG TOTAL) BY MOUTH DAILY. *APPOINTMENT NEEDED FOR FUTURE REFILLS   aspirin EC 81 MG tablet Take 81 mg by mouth daily.   atorvastatin (LIPITOR) 20 MG tablet Take 1 tablet (20 mg  total) by mouth daily.   losartan (COZAAR) 100 MG tablet TAKE 1 TABLET BY MOUTH EVERY DAY   metoprolol tartrate (LOPRESSOR) 100 MG tablet Take 1 tablet (1017m TWO hours prior to CT scan   nitroGLYCERIN (NITROSTAT) 0.4 MG SL tablet Place 1 tablet (0.4 mg total) under the tongue every 5 (five) minutes as needed for chest pain.   omeprazole (PRILOSEC) 20 MG capsule Take 20 mg by mouth daily.   No facility-administered encounter medications on file as of 08/27/2021.    Allergies (verified) Patient has no known allergies.   History: Past Medical History:  Diagnosis Date   Chicken pox    Family history of breast cancer    GERD (gastroesophageal reflux disease)    Hypertension    Past Surgical History:  Procedure Laterality Date   MANDIBLE SURGERY  1980   nose repair  1970   deviated septum   REPAIR ANKLE LIGAMENT  1975   REPAIR KNEE LIGAMENT  1980    accident   TONSILLECTOMY     Family History  Problem Relation Age of Onset   Hyperlipidemia Mother    Hypertension Mother    Breast cancer Mother        dx in her [id number]+   Hyperlipidemia Father    Hypertension Father    Diabetes Sister        type 2   Hyperlipidemia Sister    Heart disease Sister    Brain cancer Maternal Aunt        brain stem   Prostate cancer Maternal Uncle  Heart disease Paternal Uncle    Heart attack Maternal Grandfather    Breast cancer Sister 48       BRCA2+   Breast cancer Cousin        mat first cousin   Breast cancer Cousin        mat first cousin   Breast cancer Other        MGM's sister   Pancreatic cancer Maternal Great-grandmother    Breast cancer Other        MGMs sister   Breast cancer Cousin        mother's pat first cousin   Breast cancer Cousin        pt's pat second cousin, once removed - BRCA pos   Colon cancer Neg Hx    Stomach cancer Neg Hx    Social History   Socioeconomic History   Marital status: Married    Spouse name: Not on file   Number of children: Not  on file   Years of education: Not on file   Highest education level: Not on file  Occupational History   Not on file  Tobacco Use   Smoking status: Never   Smokeless tobacco: Never  Vaping Use   Vaping Use: Never used  Substance and Sexual Activity   Alcohol use: No   Drug use: No   Sexual activity: Not on file  Other Topics Concern   Not on file  Social History Narrative   Not on file   Social Determinants of Health   Financial Resource Strain: Low Risk  (08/27/2021)   Overall Financial Resource Strain (CARDIA)    Difficulty of Paying Living Expenses: Not hard at all  Food Insecurity: No Food Insecurity (08/27/2021)   Hunger Vital Sign    Worried About Running Out of Food in the Last Year: Never true    [place] in the Last Year: Never true  Transportation Needs: No Transportation Needs (08/27/2021)   PRAPARE - Hydrologist (Medical): No    Lack of Transportation (Non-Medical): No  Physical Activity: Inactive (08/27/2021)   Exercise Vital Sign    Days of Exercise per Week: 0 days    Minutes of Exercise per Session: 0 min  Stress: No Stress Concern Present (08/27/2021)   [place] of Stress : Not at all  Social Connections: [place] (08/27/2021)   Social Connection and Isolation Panel [NHANES]    Frequency of Communication with Friends and Family: More than three times a week    Frequency of Social Gatherings with Friends and Family: Once a week    Attends Religious Services: More than 4 times per year    Active Member of [employer] or Organizations: Yes    Attends Music therapist: More than 4 times per year    Marital Status: Married     Clinical Intake:  Pre-visit preparation completed: Yes How often do you need to have someone help you when you read instructions, pamphlets, or other written materials from your doctor or pharmacy?: 1 -  Never  Diabetic?  No   Activities of Daily Living    08/27/2021    8:57 AM 08/24/2021    2:18 PM  In your present state of health, do you have any difficulty performing the following activities:  Hearing? 0 0  Vision? 0 0  Difficulty concentrating or making decisions? 0 0  Walking or climbing stairs? 0 0  Dressing or bathing? 0 0  Doing errands, shopping? 0 0  Preparing Food and eating ? N N  Using the Toilet? N N  In the past six months, have you accidently leaked urine? N N  Do you have problems with loss of bowel control? N N  Managing your Medications? N N  Managing your Finances? N N  Housekeeping or managing your Housekeeping? N N    Patient Care Team: Eulas Post, MD as PCP - General (Family Medicine)  Indicate any recent Medical Services you may have received from other than [facility] providers in the past year (date may be approximate).     Assessment:   This is a routine wellness examination for Miguel [name].  Hearing/Vision screen Hearing Screening - Comments:: No difficulty hearing Vision Screening - Comments:: Wear glasses. [place]  Dietary issues and exercise activities discussed: Exercise limited by: None identified   Goals Addressed               This Visit's Progress     No current goals (pt-stated)         Depression Screen    08/27/2021    8:55 AM 04/17/2021    8:00 AM 06/04/2019   10:04 AM 11/29/2017    9:39 AM  PHQ 2/9 Scores  PHQ - 2 Score 0 0 0 0    Fall Risk    08/27/2021    8:58 AM 08/24/2021    2:18 PM 04/17/2021    8:00 AM  Fall Risk   Falls in the past year? 0 0 0  Number falls in past yr: 0 0   Injury with Fall? 0    Risk for fall due to : No Fall Risks      FALL RISK PREVENTION PERTAINING TO THE HOME:  Any stairs in or around the home? Yes  If so, are there any without handrails? No  Home free of loose throw rugs in walkways, pet beds, electrical cords, etc? Yes  Adequate lighting in your home to  reduce risk of falls? Yes   ASSISTIVE DEVICES UTILIZED TO PREVENT FALLS:  Life alert? No  Use of a cane, walker or w/c? No  Grab bars in the bathroom? No  Shower chair or bench in shower? No  Elevated toilet seat or a handicapped toilet? No   TIMED UP AND GO:  Was the test performed? No . Audio Visit  Cognitive Function:        08/27/2021    8:59 AM  6CIT Screen  What Year? 0 points  What month? 0 points  What time? 0 points  Count back from 20 0 points  Months in reverse 0 points  Repeat phrase 0 points  Total Score 0 points    Immunizations Immunization History  Administered Date(s) Administered   PNEUMOCOCCAL CONJUGATE-20 04/17/2021   Td 03/05/2004   Tdap 05/07/2015    TDAP status: Up to date  Flu Vaccine status: Up to date  Pneumococcal vaccine status: Up to date  Covid-19 vaccine status: Completed vaccines  Qualifies for Shingles Vaccine? Yes   Zostavax completed No   Shingrix Completed?: No.    Education has been provided regarding the importance of this vaccine. Patient has been advised to call insurance company to determine out of pocket expense if they have not yet received this vaccine. Advised may also receive vaccine at local pharmacy or Health Dept. Verbalized acceptance and understanding.  Screening  Tests Health Maintenance  Topic Date Due   COVID-19 Vaccine (1) 09/12/2021 (Originally 10/17/1955)   Zoster Vaccines- Shingrix (1 of 2) 11/27/2021 (Originally 04/18/2005)   COLONOSCOPY (Pts 45-2yr Insurance coverage will need to be confirmed)  08/28/2022 (Originally 06/08/2021)   INFLUENZA VACCINE  09/29/2021   TETANUS/TDAP  05/06/2025   Pneumonia Vaccine 66 Years old  Completed   Hepatitis C Screening  Completed   HPV VACCINES  Aged Out    Health Maintenance  There are no preventive care reminders to display for this patient.   Colorectal cancer screening: Referral to GI placed Patient deferred. Pt aware the office will call re:  appt.  Lung Cancer Screening: (Low Dose CT Chest recommended if Age 66-80years, 30 pack-year currently smoking OR have quit w/in 15years.) does not qualify.     Additional Screening:  Hepatitis C Screening: does qualify; Completed 12/07/17  Vision Screening: Recommended annual ophthalmology exams for early detection of glaucoma and other disorders of the eye. Is the patient up to date with their annual eye exam?  Yes  Who is the provider or what is the name of the office in which the patient attends annual eye exams[facility] not [facility] a provider, would they like to be referred to a provider to establish care? No .   Dental Screening: Recommended annual dental exams for proper oral hygiene  Community Resource Referral / Chronic Care Management:  CRR required this visit?  No   CCM required this visit?  No      Plan:     I have personally reviewed and noted the following in the patient's chart:   Medical and social history Use of alcohol, tobacco or illicit drugs  Current medications and supplements including opioid prescriptions. Patient is not currently taking opioid prescriptions. Functional ability and status Nutritional status Physical activity Advanced directives List of other physicians Hospitalizations, surgeries, and ER visits in previous 12 months Vitals Screenings to include cognitive, depression, and falls Referrals and appointments  In addition, I have reviewed and discussed with patient certain preventive protocols, quality metrics, and best practice recommendations. A written personalized care plan for preventive services as well as general preventive health recommendations were provided to patient.     BCriselda Peaches LPN   [id number]  Nurse Notes: None

## 2021-08-27 NOTE — Patient Instructions (Addendum)
Miguel [name] , Thank you for taking time to come for your Medicare Wellness Visit. I appreciate your ongoing commitment to your health goals. Please review the following plan we discussed and let me know if I can assist you in the future.   These are the goals we discussed:  Goals       No current goals (pt-stated)        This is a list of the screening recommended for you and due dates:  Health Maintenance  Topic Date Due   COVID-19 Vaccine (1) 09/12/2021*   Zoster (Shingles) Vaccine (1 of 2) 11/27/2021*   Colon Cancer Screening  08/28/2022*   Flu Shot  09/29/2021   Tetanus Vaccine  05/06/2025   Pneumonia Vaccine  Completed   Hepatitis C Screening: USPSTF Recommendation to screen - Ages 18-79 yo.  Completed   HPV Vaccine  Aged Out  *Topic was postponed. The date shown is not the original due date.   Advanced directives: Yes  Conditions/risks identified: None  Next appointment: Follow up in one year for your annual wellness visit.    Preventive Care 2 Years and Older, Male Preventive care refers to lifestyle choices and visits with your health care provider that can promote health and wellness. What does preventive care include? A yearly physical exam. This is also called an annual well check. Dental exams once or twice a year. Routine eye exams. Ask your health care provider how often you should have your eyes checked. Personal lifestyle choices, including: Daily care of your teeth and gums. Regular physical activity. Eating a healthy diet. Avoiding tobacco and drug use. Limiting alcohol use. Practicing safe sex. Taking low doses of aspirin every day. Taking vitamin and mineral supplements as recommended by your health care provider. What happens during an annual well check? The services and screenings done by your health care provider during your annual well check will depend on your age, overall health, lifestyle risk factors, and family history of  disease. Counseling  Your health care provider may ask you questions about your: Alcohol use. Tobacco use. Drug use. Emotional well-being. Home and relationship well-being. Sexual activity. Eating habits. History of falls. Memory and ability to understand (cognition). Work and work Astronomer. Screening  You may have the following tests or measurements: Height, weight, and BMI. Blood pressure. Lipid and cholesterol levels. These may be checked every 5 years, or more frequently if you are over 15 years old. Skin check. Lung cancer screening. You may have this screening every year starting at age 109 if you have a 30-pack-year history of smoking and currently smoke or have quit within the past 15 years. Fecal occult blood test (FOBT) of the stool. You may have this test every year starting at age 57. Flexible sigmoidoscopy or colonoscopy. You may have a sigmoidoscopy every 5 years or a colonoscopy every 10 years starting at age 35. Prostate cancer screening. Recommendations will vary depending on your family history and other risks. Hepatitis C blood test. Hepatitis B blood test. Sexually transmitted disease (STD) testing. Diabetes screening. This is done by checking your blood sugar (glucose) after you have not eaten for a while (fasting). You may have this done every 1-3 years. Abdominal aortic aneurysm (AAA) screening. You may need this if you are a current or former smoker. Osteoporosis. You may be screened starting at age 74 if you are at high risk. Talk with your health care provider about your test results, treatment options, and if necessary, the need  for more tests. Vaccines  Your health care provider may recommend certain vaccines, such as: Influenza vaccine. This is recommended every year. Tetanus, diphtheria, and acellular pertussis (Tdap, Td) vaccine. You may need a Td booster every 10 years. Zoster vaccine. You may need this after age 75. Pneumococcal 13-valent  conjugate (PCV13) vaccine. One dose is recommended after age 62. Pneumococcal polysaccharide (PPSV23) vaccine. One dose is recommended after age 73. Talk to your health care provider about which screenings and vaccines you need and how often you need them. This information is not intended to replace advice given to you by your health care provider. Make sure you discuss any questions you have with your health care provider. Document Released: 03/14/2015 Document Revised: 11/05/2015 Document Reviewed: 12/17/2014 Elsevier Interactive Patient Education  2017 [place] Prevention in the Home Falls can cause injuries. They can happen to people of all ages. There are many things you can do to make your home safe and to help prevent falls. What can I do on the outside of my home? Regularly fix the edges of walkways and driveways and fix any cracks. Remove anything that might make you trip as you walk through a door, such as a raised step or threshold. Trim any bushes or trees on the path to your home. Use bright outdoor lighting. Clear any walking paths of anything that might make someone trip, such as rocks or tools. Regularly check to see if handrails are loose or broken. Make sure that both sides of any steps have handrails. Any raised decks and porches should have guardrails on the edges. Have any leaves, snow, or ice cleared regularly. Use sand or salt on walking paths during winter. Clean up any spills in your garage right away. This includes oil or grease spills. What can I do in the bathroom? Use night lights. Install grab bars by the toilet and in the tub and shower. Do not use towel bars as grab bars. Use non-skid mats or decals in the tub or shower. If you need to sit down in the shower, use a plastic, non-slip stool. Keep the floor dry. Clean up any water that spills on the floor as soon as it happens. Remove soap buildup in the tub or shower regularly. Attach bath mats  securely with double-sided non-slip rug tape. Do not have throw rugs and other things on the floor that can make you trip. What can I do in the bedroom? Use night lights. Make sure that you have a light by your bed that is easy to reach. Do not use any sheets or blankets that are too big for your bed. They should not hang down onto the floor. Have a firm chair that has side arms. You can use this for support while you get dressed. Do not have throw rugs and other things on the floor that can make you trip. What can I do in the kitchen? Clean up any spills right away. Avoid walking on wet floors. Keep items that you use a lot in easy-to-reach places. If you need to reach something above you, use a strong step stool that has a grab bar. Keep electrical cords out of the way. Do not use floor polish or wax that makes floors slippery. If you must use wax, use non-skid floor wax. Do not have throw rugs and other things on the floor that can make you trip. What can I do with my stairs? Do not leave any items on the stairs.  Make sure that there are handrails on both sides of the stairs and use them. Fix handrails that are broken or loose. Make sure that handrails are as long as the stairways. Check any carpeting to make sure that it is firmly attached to the stairs. Fix any carpet that is loose or worn. Avoid having throw rugs at the top or bottom of the stairs. If you do have throw rugs, attach them to the floor with carpet tape. Make sure that you have a light switch at the top of the stairs and the bottom of the stairs. If you do not have them, ask someone to add them for you. What else can I do to help prevent falls? Wear shoes that: Do not have high heels. Have rubber bottoms. Are comfortable and fit you well. Are closed at the toe. Do not wear sandals. If you use a stepladder: Make sure that it is fully opened. Do not climb a closed stepladder. Make sure that both sides of the stepladder  are locked into place. Ask someone to hold it for you, if possible. Clearly mark and make sure that you can see: Any grab bars or handrails. First and last steps. Where the edge of each step is. Use tools that help you move around (mobility aids) if they are needed. These include: Canes. Walkers. Scooters. Crutches. Turn on the lights when you go into a dark area. Replace any light bulbs as soon as they burn out. Set up your furniture so you have a clear path. Avoid moving your furniture around. If any of your floors are uneven, fix them. If there are any pets around you, be aware of where they are. Review your medicines with your doctor. Some medicines can make you feel dizzy. This can increase your chance of falling. Ask your doctor what other things that you can do to help prevent falls. This information is not intended to replace advice given to you by your health care provider. Make sure you discuss any questions you have with your health care provider. Document Released: 12/12/2008 Document Revised: 07/24/2015 Document Reviewed: 03/22/2014 Elsevier Interactive Patient Education  2017 [employer].

## 2021-09-10 ENCOUNTER — Ambulatory Visit (INDEPENDENT_AMBULATORY_CARE_PROVIDER_SITE_OTHER): Payer: Medicare HMO

## 2021-09-10 DIAGNOSIS — R079 Chest pain, unspecified: Secondary | ICD-10-CM

## 2021-09-10 LAB — ECHOCARDIOGRAM COMPLETE
AR max vel: 3.91 cm2
AV Area VTI: 3.71 cm2
AV Area mean vel: 3.71 cm2
AV Mean grad: 2 mmHg
AV Peak grad: 4.2 mmHg
Ao pk vel: 1.02 m/s
Area-P 1/2: 3.37 cm2
S' Lateral: 2.99 cm

## 2021-09-16 ENCOUNTER — Telehealth (HOSPITAL_COMMUNITY): Payer: Self-pay | Admitting: *Deleted

## 2021-09-16 NOTE — Telephone Encounter (Signed)
Reaching out to patient to offer assistance regarding upcoming cardiac imaging study; pt verbalizes understanding of appt date/time, parking situation and where to check in, pre-test NPO status and medications ordered, and verified current allergies; name and call back number provided for further questions should they arise  Yovanni Frenette RN Navigator Cardiac Imaging [facility] [phone] office [phone] cell  Patient to take 100mg metoprolol tartrate two hours prior to his cardiac CT scan. He is aware to arrive at 9:30am. 

## 2021-09-17 ENCOUNTER — Ambulatory Visit (HOSPITAL_BASED_OUTPATIENT_CLINIC_OR_DEPARTMENT_OTHER)
Admission: RE | Admit: 2021-09-17 | Discharge: 2021-09-17 | Disposition: A | Discharge status: Home / Self Care | Payer: Medicare HMO | Source: Ambulatory Visit | Attending: Cardiology | Admitting: Cardiology

## 2021-09-17 ENCOUNTER — Other Ambulatory Visit: Payer: Self-pay | Admitting: Cardiology

## 2021-09-17 ENCOUNTER — Ambulatory Visit (HOSPITAL_COMMUNITY)
Admission: RE | Admit: 2021-09-17 | Discharge: 2021-09-17 | Disposition: A | Discharge status: Home / Self Care | Payer: Medicare HMO | Source: Ambulatory Visit | Attending: Cardiology | Admitting: Cardiology

## 2021-09-17 DIAGNOSIS — R918 Other nonspecific abnormal finding of lung field: Secondary | ICD-10-CM | POA: Insufficient documentation

## 2021-09-17 DIAGNOSIS — R931 Abnormal findings on diagnostic imaging of heart and coronary circulation: Secondary | ICD-10-CM | POA: Diagnosis not present

## 2021-09-17 DIAGNOSIS — I2510 Atherosclerotic heart disease of native coronary artery without angina pectoris: Secondary | ICD-10-CM | POA: Diagnosis not present

## 2021-09-17 DIAGNOSIS — R079 Chest pain, unspecified: Secondary | ICD-10-CM | POA: Insufficient documentation

## 2021-09-17 MED ORDER — NITROGLYCERIN 0.4 MG SL SUBL
0.8000 mg | SUBLINGUAL_TABLET | Freq: Once | SUBLINGUAL | Status: AC
  Administered 2021-09-17: 0.8 mg via SUBLINGUAL

## 2021-09-17 MED ORDER — NITROGLYCERIN 0.4 MG SL SUBL
SUBLINGUAL_TABLET | SUBLINGUAL | Status: AC
  Filled 2021-09-17: qty 2

## 2021-09-17 MED ORDER — IOHEXOL 350 MG/ML SOLN
100.0000 mL | Freq: Once | INTRAVENOUS | Status: AC | PRN
  Administered 2021-09-17: 100 mL via INTRAVENOUS

## 2021-09-21 ENCOUNTER — Other Ambulatory Visit: Payer: Self-pay

## 2021-09-21 ENCOUNTER — Telehealth: Payer: Self-pay | Admitting: Cardiology

## 2021-09-21 MED ORDER — METOPROLOL SUCCINATE ER 25 MG PO TB24: 25.0000 mg | ORAL_TABLET | Freq: Every day | ORAL | 1 refills | Status: DC

## 2021-09-21 NOTE — Telephone Encounter (Signed)
Called patient back, discussed CT results.   Advised of MD recommendation to start medication (RX sent to pharmacy) and follow up in 1 month (scheduled for 08/23)   Patient verbalized understanding of plan. Thankful for call back and discussion given.   Thanks!

## 2021-09-21 NOTE — Telephone Encounter (Signed)
Patient is requesting a call back to discuss CT results. 
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
## 2021-10-21 ENCOUNTER — Ambulatory Visit: Payer: Medicare HMO | Admitting: Cardiology

## 2021-10-21 ENCOUNTER — Encounter: Payer: Self-pay | Admitting: Cardiology

## 2021-10-21 VITALS — BP 124/70 | HR 66 | Ht 70.0 in | Wt 210.0 lb

## 2021-10-21 DIAGNOSIS — E785 Hyperlipidemia, unspecified: Secondary | ICD-10-CM | POA: Diagnosis not present

## 2021-10-21 DIAGNOSIS — I10 Essential (primary) hypertension: Secondary | ICD-10-CM

## 2021-10-21 DIAGNOSIS — I719 Aortic aneurysm of unspecified site, without rupture: Secondary | ICD-10-CM

## 2021-10-21 DIAGNOSIS — I2510 Atherosclerotic heart disease of native coronary artery without angina pectoris: Secondary | ICD-10-CM | POA: Diagnosis not present

## 2021-10-21 NOTE — Patient Instructions (Signed)
Medication Instructions:  Your physician recommends that you continue on your current medications as directed. Please refer to the Current Medication list given to you today.  *If you need a refill on your cardiac medications before your next appointment, please call your pharmacy*   Lab Work: Lipid today  If you have labs (blood work) drawn today and your tests are completely normal, you will receive your results only by: MyChart Message (if you have MyChart) OR A paper copy in the mail If you have any lab test that is abnormal or we need to change your treatment, we will call you to review the results.  Follow-Up: At [facility], you and your health needs are our priority.  As part of our continuing mission to provide you with exceptional heart care, we have created designated Provider Care Teams.  These Care Teams include your primary Cardiologist (physician) and Advanced Practice Providers (APPs -  Physician Assistants and Nurse Practitioners) who all work together to provide you with the care you need, when you need it.  We recommend signing up for the patient portal called "MyChart".  Sign up information is provided on this After Visit Summary.  MyChart is used to connect with patients for Virtual Visits (Telemedicine).  Patients are able to view lab/test results, encounter notes, upcoming appointments, etc.  Non-urgent messages can be sent to your provider as well.   To learn more about what you can do with MyChart, go to [email or web address].    Your next appointment:   3 month(s)  The format for your next appointment:   In Person  Provider:   Dr. Bjorn Pippin        ]

## 2021-10-22 LAB — LIPID PANEL
Chol/HDL Ratio: 2.9 ratio (ref 0.0–5.0)
Cholesterol, Total: 96 mg/dL — ABNORMAL LOW (ref 100–199)
HDL: 33 mg/dL — ABNORMAL LOW (ref >39)
LDL Chol Calc (NIH): 36 mg/dL (ref 0–99)
Triglycerides: 163 mg/dL — ABNORMAL HIGH (ref 0–149)
VLDL Cholesterol Cal: 27 mg/dL (ref 5–40)

## 2021-10-28 ENCOUNTER — Encounter: Payer: Self-pay | Admitting: *Deleted

## 2021-12-02 ENCOUNTER — Other Ambulatory Visit: Payer: Self-pay

## 2021-12-02 MED ORDER — ATORVASTATIN CALCIUM 20 MG PO TABS
20.0000 mg | ORAL_TABLET | Freq: Every day | ORAL | 3 refills | Status: DC
Start: 2021-12-02 — End: 2022-04-20
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
## 2021-12-03 ENCOUNTER — Encounter: Payer: Self-pay | Admitting: Cardiology

## 2021-12-03 ENCOUNTER — Ambulatory Visit: Payer: Medicare HMO | Attending: Cardiology | Admitting: Cardiology

## 2021-12-03 VITALS — BP 124/82 | HR 66 | Ht 70.0 in | Wt 216.0 lb

## 2021-12-03 DIAGNOSIS — I2510 Atherosclerotic heart disease of native coronary artery without angina pectoris: Secondary | ICD-10-CM

## 2021-12-03 DIAGNOSIS — I719 Aortic aneurysm of unspecified site, without rupture: Secondary | ICD-10-CM | POA: Diagnosis not present

## 2021-12-03 DIAGNOSIS — E785 Hyperlipidemia, unspecified: Secondary | ICD-10-CM

## 2021-12-03 DIAGNOSIS — I10 Essential (primary) hypertension: Secondary | ICD-10-CM

## 2021-12-03 NOTE — Patient Instructions (Signed)
Medication Instructions:  Your physician recommends that you continue on your current medications as directed. Please refer to the Current Medication list given to you today.  *If you need a refill on your cardiac medications before your next appointment, please call your pharmacy*  Follow-Up: At [facility], you and your health needs are our priority.  As part of our continuing mission to provide you with exceptional heart care, we have created designated Provider Care Teams.  These Care Teams include your primary Cardiologist (physician) and Advanced Practice Providers (APPs -  Physician Assistants and Nurse Practitioners) who all work together to provide you with the care you need, when you need it.  We recommend signing up for the patient portal called "MyChart".  Sign up information is provided on this After Visit Summary.  MyChart is used to connect with patients for Virtual Visits (Telemedicine).  Patients are able to view lab/test results, encounter notes, upcoming appointments, etc.  Non-urgent messages can be sent to your provider as well.   To learn more about what you can do with MyChart, go to [email or web address].    Your next appointment:   6 month(s)  The format for your next appointment:   In Person  Provider:   Dr. Schumann       

## 2021-12-13 ENCOUNTER — Other Ambulatory Visit: Payer: Self-pay | Admitting: Family Medicine

## 2022-03-10 ENCOUNTER — Other Ambulatory Visit: Payer: Self-pay | Admitting: Cardiology

## 2022-03-25 DIAGNOSIS — H2513 Age-related nuclear cataract, bilateral: Secondary | ICD-10-CM | POA: Diagnosis not present

## 2022-04-20 ENCOUNTER — Encounter: Payer: Self-pay | Admitting: Family Medicine

## 2022-04-20 ENCOUNTER — Ambulatory Visit (INDEPENDENT_AMBULATORY_CARE_PROVIDER_SITE_OTHER): Payer: Medicare HMO | Admitting: Family Medicine

## 2022-04-20 VITALS — BP 130/70 | HR 70 | Temp 97.9°F | Ht 68.9 in | Wt 217.3 lb

## 2022-04-20 DIAGNOSIS — Z0000 Encounter for general adult medical examination without abnormal findings: Secondary | ICD-10-CM

## 2022-04-20 LAB — CBC WITH DIFFERENTIAL/PLATELET
Basophils Absolute: 0.1 10*3/uL (ref 0.0–0.1)
Basophils Relative: 1.1 % (ref 0.0–3.0)
Eosinophils Absolute: 0.2 10*3/uL (ref 0.0–0.7)
Eosinophils Relative: 2 % (ref 0.0–5.0)
HCT: 47.7 % (ref 39.0–52.0)
Hemoglobin: 15.9 g/dL (ref 13.0–17.0)
Lymphocytes Relative: 27.7 % (ref 12.0–46.0)
Lymphs Abs: 2.1 10*3/uL (ref 0.7–4.0)
MCHC: 33.4 g/dL (ref 30.0–36.0)
MCV: 92.2 fl (ref 78.0–100.0)
Monocytes Absolute: 0.8 10*3/uL (ref 0.1–1.0)
Monocytes Relative: 10.7 % (ref 3.0–12.0)
Neutro Abs: 4.4 10*3/uL (ref 1.4–7.7)
Neutrophils Relative %: 58.5 % (ref 43.0–77.0)
Platelets: 339 10*3/uL (ref 150.0–400.0)
RBC: 5.17 Mil/uL (ref 4.22–5.81)
RDW: 13.7 % (ref 11.5–15.5)
WBC: 7.5 10*3/uL (ref 4.0–10.5)

## 2022-04-20 LAB — TSH: TSH: 2.75 u[IU]/mL (ref 0.35–5.50)

## 2022-04-20 LAB — BASIC METABOLIC PANEL
BUN: 15 mg/dL (ref 6–23)
CO2: 27 mEq/L (ref 19–32)
Calcium: 10 mg/dL (ref 8.4–10.5)
Chloride: 103 mEq/L (ref 96–112)
Creatinine, Ser: 1.06 mg/dL (ref 0.40–1.50)
GFR: 72.88 mL/min (ref >60.00)
Glucose, Bld: 113 mg/dL — ABNORMAL HIGH (ref 70–99)
Potassium: 4.6 mEq/L (ref 3.5–5.1)
Sodium: 142 mEq/L (ref 135–145)

## 2022-04-20 LAB — HEPATIC FUNCTION PANEL
ALT: 26 U/L (ref 0–53)
AST: 23 U/L (ref 0–37)
Albumin: 4.4 g/dL (ref 3.5–5.2)
Alkaline Phosphatase: 78 U/L (ref 39–117)
Bilirubin, Direct: 0.2 mg/dL (ref 0.0–0.3)
Total Bilirubin: 0.6 mg/dL (ref 0.2–1.2)
Total Protein: 7.6 g/dL (ref 6.0–8.3)

## 2022-04-20 LAB — LIPID PANEL
Cholesterol: 87 mg/dL (ref 0–200)
HDL: 32.6 mg/dL — ABNORMAL LOW (ref >39.00)
LDL Cholesterol: 32 mg/dL (ref 0–99)
NonHDL: 53.98
Total CHOL/HDL Ratio: 3
Triglycerides: 112 mg/dL (ref 0.0–149.0)
VLDL: 22.4 mg/dL (ref 0.0–40.0)

## 2022-04-20 LAB — PSA: PSA: 1.18 ng/mL (ref 0.10–4.00)

## 2022-04-20 MED ORDER — ROSUVASTATIN CALCIUM 10 MG PO TABS: 10.0000 mg | ORAL_TABLET | Freq: Every day | ORAL | 3 refills | Status: DC

## 2022-04-20 NOTE — Patient Instructions (Signed)
Stop the Lipitor and Start Crestor 10 mg once daily  Consider follow up lipid panel in couple of months.
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
## 2022-05-15 ENCOUNTER — Other Ambulatory Visit: Payer: Self-pay | Admitting: Family Medicine

## 2022-05-21 ENCOUNTER — Other Ambulatory Visit: Payer: Self-pay | Admitting: Family Medicine

## 2022-06-16 ENCOUNTER — Encounter: Payer: Self-pay | Admitting: Genetic Counselor

## 2022-06-16 NOTE — Progress Notes (Signed)
  UPDATE: TSC2 c.169C>T VUS has been reclassified as Likely Benign.  The amended report is June 04, 2022.

## 2022-07-13 ENCOUNTER — Other Ambulatory Visit: Payer: Self-pay | Admitting: Thoracic Surgery (Cardiothoracic Vascular Surgery)

## 2022-07-13 ENCOUNTER — Other Ambulatory Visit: Payer: Self-pay | Admitting: Cardiology

## 2022-07-13 DIAGNOSIS — R918 Other nonspecific abnormal finding of lung field: Secondary | ICD-10-CM
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
## 2022-08-16 ENCOUNTER — Ambulatory Visit: Payer: Medicare HMO | Attending: Student | Admitting: Student

## 2022-08-16 ENCOUNTER — Encounter: Payer: Self-pay | Admitting: Student

## 2022-08-16 VITALS — BP 132/76 | HR 72 | Ht 70.0 in | Wt 218.0 lb

## 2022-08-16 DIAGNOSIS — E785 Hyperlipidemia, unspecified: Secondary | ICD-10-CM

## 2022-08-16 DIAGNOSIS — I25118 Atherosclerotic heart disease of native coronary artery with other forms of angina pectoris: Secondary | ICD-10-CM

## 2022-08-16 DIAGNOSIS — Z01818 Encounter for other preprocedural examination: Secondary | ICD-10-CM | POA: Diagnosis not present

## 2022-08-16 DIAGNOSIS — I7123 Aneurysm of the descending thoracic aorta, without rupture: Secondary | ICD-10-CM | POA: Diagnosis not present

## 2022-08-16 DIAGNOSIS — I10 Essential (primary) hypertension: Secondary | ICD-10-CM

## 2022-08-16 MED ORDER — SODIUM CHLORIDE 0.9% FLUSH: 3.0000 mL | Freq: Two times a day (BID) | INTRAVENOUS | Status: DC

## 2022-08-16 NOTE — Patient Instructions (Signed)
Medication Instructions:   Your physician recommends that you continue on your current medications as directed. Please refer to the Current Medication list given to you today.  *If you need a refill on your cardiac medications before your next appointment, please call your pharmacy*  Lab Work: Lestine Mount, NP recommends that you return for lab work TODAY:  BMP CBC  If you have labs (blood work) drawn today and your tests are completely normal, you will receive your results only by: MyChart Message (if you have MyChart) OR A paper copy in the mail If you have any lab test that is abnormal or we need to change your treatment, we will call you to review the results.  Testing/Procedures: Miguel Levering, NP has requested that you have a cardiac catheterization. Cardiac catheterization is used to diagnose and/or treat various heart conditions. Doctors may recommend this procedure for a number of different reasons. The most common reason is to evaluate chest pain. Chest pain can be a symptom of coronary artery disease (CAD), and cardiac catheterization can show whether plaque is narrowing or blocking your heart's arteries. This procedure is also used to evaluate the valves, as well as measure the blood flow and oxygen levels in different parts of your heart. For further information please visit [email or web address] Please follow instruction sheet, as given.   Scheduled for Wednesday 08/25/22 at 8:30 AM, you will need to arrive at [facility] hospital at 6:30 AM   Follow-Up: At [facility], you and your health needs are our priority.  As part of our continuing mission to provide you with exceptional heart care, we have created designated Provider Care Teams.  These Care Teams include your primary Cardiologist (physician) and Advanced Practice Providers (APPs -  Physician Assistants and Nurse Practitioners) who all work together to provide you with the care you need, when you need  it.  Your next appointment:   3 week(s) Post Heart Cath   Provider:   Carlos Levering, NP        Other Instructions         Cardiac/Peripheral Catheterization   You are scheduled for a Cardiac Catheterization on Wednesday, June 26 with Dr. Peter [place].  1. Please arrive at the [facility] (Main Entrance A) at [facility]: [address] [place], [place] [address] at 6:30 AM (This time is 2 hour(s) before your procedure to ensure your preparation). Free valet parking service is available. You will check in at ADMITTING. The support person will be asked to wait in the waiting room.  It is OK to have someone drop you off and come back when you are ready to be discharged.        Special note: Every effort is made to have your procedure done on time. Please understand that emergencies sometimes delay scheduled procedures.  2. Diet: Do not eat solid foods after midnight.  You may have clear liquids until 5 AM the day of the procedure.  3. Labs: You will need to have blood drawn on Monday, June 17 at [facility] [address], [place]  Open: 8am - 5pm (Lunch 12:30 - 1:30)   Phone: [phone]. You do not need to be fasting.  4. Medication instructions in preparation for your procedure:   Contrast Allergy: No  Stop taking, Cozaar (Losartan) Wednesday, June 26, and RESTART on Thursday, June 27th    On the morning of your procedure, take Aspirin 81 mg and any morning medicines NOT listed  above.  You may use sips of water.  5. Plan to go home the same day, you will only stay overnight if medically necessary. 6. You MUST have a responsible adult to drive you home. 7. An adult MUST be with you the first 24 hours after you arrive home. 8. Bring a current list of your medications, and the last time and date medication taken. 9. Bring ID and current insurance cards. 10.Please wear clothes that are easy to get on and off and wear slip-on shoes.  Thank you for  allowing [place] to care for you!   -- [facility]

## 2022-08-17 LAB — BASIC METABOLIC PANEL
BUN/Creatinine Ratio: 12 (ref 10–24)
BUN: 12 mg/dL (ref 8–27)
CO2: 26 mmol/L (ref 20–29)
Calcium: 9.8 mg/dL (ref 8.6–10.2)
Chloride: 101 mmol/L (ref 96–106)
Creatinine, Ser: 0.98 mg/dL (ref 0.76–1.27)
Glucose: 101 mg/dL — ABNORMAL HIGH (ref 70–99)
Potassium: 5.1 mmol/L (ref 3.5–5.2)
Sodium: 137 mmol/L (ref 134–144)
eGFR: 85 mL/min/{1.73_m2} (ref >59)

## 2022-08-17 LAB — CBC
Hematocrit: 48.4 % (ref 37.5–51.0)
Hemoglobin: 16.5 g/dL (ref 13.0–17.7)
MCH: 31.5 pg (ref 26.6–33.0)
MCHC: 34.1 g/dL (ref 31.5–35.7)
MCV: 92 fL (ref 79–97)
Platelets: 324 10*3/uL (ref 150–450)
RBC: 5.24 x10E6/uL (ref 4.14–5.80)
RDW: 12.3 % (ref 11.6–15.4)
WBC: 8.4 10*3/uL (ref 3.4–10.8)

## 2022-08-19 ENCOUNTER — Ambulatory Visit
Admission: RE | Admit: 2022-08-19 | Discharge: 2022-08-19 | Disposition: A | Discharge status: Home / Self Care | Payer: Medicare HMO | Source: Ambulatory Visit | Attending: Thoracic Surgery (Cardiothoracic Vascular Surgery) | Admitting: Thoracic Surgery (Cardiothoracic Vascular Surgery)

## 2022-08-19 DIAGNOSIS — R911 Solitary pulmonary nodule: Secondary | ICD-10-CM | POA: Diagnosis not present

## 2022-08-19 DIAGNOSIS — I700 Atherosclerosis of aorta: Secondary | ICD-10-CM | POA: Diagnosis not present

## 2022-08-19 DIAGNOSIS — R918 Other nonspecific abnormal finding of lung field: Secondary | ICD-10-CM

## 2022-08-19 DIAGNOSIS — I2510 Atherosclerotic heart disease of native coronary artery without angina pectoris: Secondary | ICD-10-CM | POA: Diagnosis not present

## 2022-08-19 DIAGNOSIS — I712 Thoracic aortic aneurysm, without rupture, unspecified: Secondary | ICD-10-CM | POA: Diagnosis not present

## 2022-08-24 ENCOUNTER — Ambulatory Visit: Payer: Medicare HMO | Admitting: Thoracic Surgery (Cardiothoracic Vascular Surgery)

## 2022-08-24 VITALS — BP 133/80 | HR 72 | Resp 18 | Ht 70.0 in | Wt 208.0 lb

## 2022-08-24 DIAGNOSIS — I7121 Aneurysm of the ascending aorta, without rupture: Secondary | ICD-10-CM

## 2022-08-24 DIAGNOSIS — I712 Thoracic aortic aneurysm, without rupture, unspecified: Secondary | ICD-10-CM | POA: Insufficient documentation
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
## 2022-08-25 ENCOUNTER — Ambulatory Visit (HOSPITAL_COMMUNITY)
Admission: RE | Admit: 2022-08-25 | Discharge: 2022-08-25 | Disposition: A | Discharge status: Home / Self Care | Payer: Medicare HMO | Attending: Cardiology | Admitting: Cardiology

## 2022-08-25 ENCOUNTER — Encounter (HOSPITAL_COMMUNITY)
Admission: RE | Disposition: A | Discharge status: Home / Self Care | Payer: Self-pay | Source: Home / Self Care | Attending: Cardiology

## 2022-08-25 ENCOUNTER — Other Ambulatory Visit (HOSPITAL_COMMUNITY): Payer: Self-pay

## 2022-08-25 ENCOUNTER — Other Ambulatory Visit: Payer: Self-pay

## 2022-08-25 DIAGNOSIS — Z79899 Other long term (current) drug therapy: Secondary | ICD-10-CM | POA: Insufficient documentation

## 2022-08-25 DIAGNOSIS — R079 Chest pain, unspecified: Secondary | ICD-10-CM | POA: Insufficient documentation

## 2022-08-25 DIAGNOSIS — Z955 Presence of coronary angioplasty implant and graft: Secondary | ICD-10-CM | POA: Insufficient documentation

## 2022-08-25 DIAGNOSIS — I7123 Aneurysm of the descending thoracic aorta, without rupture: Secondary | ICD-10-CM | POA: Insufficient documentation

## 2022-08-25 DIAGNOSIS — Z7982 Long term (current) use of aspirin: Secondary | ICD-10-CM | POA: Insufficient documentation

## 2022-08-25 DIAGNOSIS — I25118 Atherosclerotic heart disease of native coronary artery with other forms of angina pectoris: Secondary | ICD-10-CM | POA: Diagnosis not present

## 2022-08-25 DIAGNOSIS — R0609 Other forms of dyspnea: Secondary | ICD-10-CM | POA: Diagnosis not present

## 2022-08-25 DIAGNOSIS — I10 Essential (primary) hypertension: Secondary | ICD-10-CM | POA: Insufficient documentation

## 2022-08-25 DIAGNOSIS — E785 Hyperlipidemia, unspecified: Secondary | ICD-10-CM | POA: Insufficient documentation

## 2022-08-25 DIAGNOSIS — I25119 Atherosclerotic heart disease of native coronary artery with unspecified angina pectoris: Secondary | ICD-10-CM | POA: Diagnosis not present

## 2022-08-25 HISTORY — PX: CORONARY STENT INTERVENTION: CATH118234

## 2022-08-25 HISTORY — PX: CORONARY IMAGING/OCT: CATH118326

## 2022-08-25 HISTORY — PX: LEFT HEART CATH AND CORONARY ANGIOGRAPHY: CATH118249

## 2022-08-25 LAB — POCT ACTIVATED CLOTTING TIME
Activated Clotting Time: 287 seconds
Activated Clotting Time: 256 seconds
Activated Clotting Time: 434 seconds

## 2022-08-25 SURGERY — LEFT HEART CATH AND CORONARY ANGIOGRAPHY
Anesthesia: LOCAL

## 2022-08-25 MED ORDER — HEPARIN (PORCINE) IN NACL 1000-0.9 UT/500ML-% IV SOLN
INTRAVENOUS | Status: DC | PRN
  Administered 2022-08-25 (×2): 500 mL

## 2022-08-25 MED ORDER — SODIUM CHLORIDE 0.9% FLUSH: 3.0000 mL | INTRAVENOUS | Status: DC | PRN

## 2022-08-25 MED ORDER — SODIUM CHLORIDE 0.9 % WEIGHT BASED INFUSION: 1.0000 mL/kg/h | INTRAVENOUS | Status: DC

## 2022-08-25 MED ORDER — CLOPIDOGREL BISULFATE 75 MG PO TABS: 75.0000 mg | ORAL_TABLET | Freq: Every day | ORAL | Status: DC

## 2022-08-25 MED ORDER — CLOPIDOGREL BISULFATE 75 MG PO TABS
75.0000 mg | ORAL_TABLET | Freq: Every day | ORAL | Status: DC
Start: 2022-08-26 — End: 2022-08-25

## 2022-08-25 MED ORDER — FAMOTIDINE IN NACL 20-0.9 MG/50ML-% IV SOLN
INTRAVENOUS | Status: DC | PRN
  Administered 2022-08-25: 20 mg via INTRAVENOUS

## 2022-08-25 MED ORDER — MIDAZOLAM HCL 2 MG/2ML IJ SOLN
INTRAMUSCULAR | Status: DC | PRN
  Administered 2022-08-25: 2 mg via INTRAVENOUS

## 2022-08-25 MED ORDER — NITROGLYCERIN 1 MG/10 ML FOR IR/CATH LAB
INTRA_ARTERIAL | Status: AC
  Filled 2022-08-25: qty 10

## 2022-08-25 MED ORDER — LABETALOL HCL 5 MG/ML IV SOLN: 10.0000 mg | INTRAVENOUS | Status: DC | PRN

## 2022-08-25 MED ORDER — ROSUVASTATIN CALCIUM 20 MG PO TABS: 20.0000 mg | ORAL_TABLET | Freq: Every day | ORAL | 1 refills | Status: DC

## 2022-08-25 MED ORDER — MIDAZOLAM HCL 2 MG/2ML IJ SOLN
INTRAMUSCULAR | Status: AC
  Filled 2022-08-25: qty 2

## 2022-08-25 MED ORDER — SODIUM CHLORIDE 0.9 % IV SOLN: 250.0000 mL | INTRAVENOUS | Status: DC | PRN

## 2022-08-25 MED ORDER — FAMOTIDINE IN NACL 20-0.9 MG/50ML-% IV SOLN
INTRAVENOUS | Status: AC
  Filled 2022-08-25: qty 50

## 2022-08-25 MED ORDER — VERAPAMIL HCL 2.5 MG/ML IV SOLN
INTRAVENOUS | Status: AC
  Filled 2022-08-25: qty 2

## 2022-08-25 MED ORDER — VERAPAMIL HCL 2.5 MG/ML IV SOLN
INTRAVENOUS | Status: DC | PRN
  Administered 2022-08-25: 10 mL via INTRA_ARTERIAL

## 2022-08-25 MED ORDER — HEPARIN SODIUM (PORCINE) 1000 UNIT/ML IJ SOLN
INTRAMUSCULAR | Status: DC | PRN
  Administered 2022-08-25 (×2): 5000 [IU] via INTRAVENOUS
  Administered 2022-08-25: 3000 [IU] via INTRAVENOUS

## 2022-08-25 MED ORDER — HEPARIN SODIUM (PORCINE) 1000 UNIT/ML IJ SOLN
INTRAMUSCULAR | Status: AC
  Filled 2022-08-25: qty 10

## 2022-08-25 MED ORDER — CLOPIDOGREL BISULFATE 300 MG PO TABS
ORAL_TABLET | ORAL | Status: DC | PRN
  Administered 2022-08-25: 600 mg via ORAL

## 2022-08-25 MED ORDER — ACETAMINOPHEN 325 MG PO TABS
650.0000 mg | ORAL_TABLET | ORAL | Status: DC | PRN
Start: 2022-08-25 — End: 2022-08-25

## 2022-08-25 MED ORDER — NITROGLYCERIN 1 MG/10 ML FOR IR/CATH LAB
INTRA_ARTERIAL | Status: DC | PRN
  Administered 2022-08-25: 200 ug via INTRACORONARY

## 2022-08-25 MED ORDER — ONDANSETRON HCL 4 MG/2ML IJ SOLN: 4.0000 mg | Freq: Four times a day (QID) | INTRAMUSCULAR | Status: DC | PRN

## 2022-08-25 MED ORDER — ACETAMINOPHEN 325 MG PO TABS: 650.0000 mg | ORAL_TABLET | ORAL | Status: DC | PRN

## 2022-08-25 MED ORDER — SODIUM CHLORIDE 0.9% FLUSH: 3.0000 mL | Freq: Two times a day (BID) | INTRAVENOUS | Status: DC

## 2022-08-25 MED ORDER — CLOPIDOGREL BISULFATE 75 MG PO TABS
75.0000 mg | ORAL_TABLET | Freq: Every day | ORAL | 5 refills | Status: DC
  Filled 2022-08-25: qty 30, 30d supply, fill #0

## 2022-08-25 MED ORDER — ONDANSETRON HCL 4 MG/2ML IJ SOLN
4.0000 mg | Freq: Four times a day (QID) | INTRAMUSCULAR | Status: DC | PRN
Start: 2022-08-25 — End: 2022-08-25

## 2022-08-25 MED ORDER — CLOPIDOGREL BISULFATE 300 MG PO TABS
ORAL_TABLET | ORAL | Status: AC
  Filled 2022-08-25: qty 2

## 2022-08-25 MED ORDER — HYDRALAZINE HCL 20 MG/ML IJ SOLN: 10.0000 mg | INTRAMUSCULAR | Status: DC | PRN

## 2022-08-25 MED ORDER — LIDOCAINE HCL (PF) 1 % IJ SOLN
INTRAMUSCULAR | Status: AC
  Filled 2022-08-25: qty 30

## 2022-08-25 MED ORDER — ASPIRIN 81 MG PO CHEW: 81.0000 mg | CHEWABLE_TABLET | ORAL | Status: DC

## 2022-08-25 MED ORDER — IOHEXOL 350 MG/ML SOLN
INTRAVENOUS | Status: DC | PRN
  Administered 2022-08-25: 150 mL

## 2022-08-25 MED ORDER — SODIUM CHLORIDE 0.9 % WEIGHT BASED INFUSION
3.0000 mL/kg/h | INTRAVENOUS | Status: AC
  Administered 2022-08-25: 3 mL/kg/h via INTRAVENOUS

## 2022-08-25 MED ORDER — FENTANYL CITRATE (PF) 100 MCG/2ML IJ SOLN
INTRAMUSCULAR | Status: DC | PRN
  Administered 2022-08-25: 25 ug via INTRAVENOUS

## 2022-08-25 MED ORDER — FENTANYL CITRATE (PF) 100 MCG/2ML IJ SOLN
INTRAMUSCULAR | Status: AC
  Filled 2022-08-25: qty 2

## 2022-08-25 MED ORDER — PANTOPRAZOLE SODIUM 40 MG PO TBEC
40.0000 mg | DELAYED_RELEASE_TABLET | Freq: Every day | ORAL | 5 refills | Status: DC
  Filled 2022-08-25: qty 30, 30d supply, fill #0

## 2022-08-25 SURGICAL SUPPLY — 27 items
BALL SAPPHIRE NC24 3.5X8 (BALLOONS) ×1
BALLN EMERGE MR 2.5X12 (BALLOONS) ×1
BALLN ~~LOC~~ EMERGE MR 2.75X8 (BALLOONS) ×1
BALLN ~~LOC~~ EMERGE MR 3.0X6 (BALLOONS) ×1
BALLOON EMERGE MR 2.5X12 (BALLOONS) IMPLANT
BALLOON SAPPHIRE NC24 3.5X8 (BALLOONS) IMPLANT
BALLOON ~~LOC~~ EMERGE MR 2.75X8 (BALLOONS) IMPLANT
BALLOON ~~LOC~~ EMERGE MR 3.0X6 (BALLOONS) IMPLANT
CATH 5FR JL3.5 JR4 ANG PIG MP (CATHETERS) IMPLANT
CATH DRAGONFLY OPSTAR (CATHETERS) IMPLANT
CATH VISTA GUIDE 6FR XBLAD3.5 (CATHETERS) IMPLANT
DEVICE RAD COMP TR BAND LRG (VASCULAR PRODUCTS) IMPLANT
ELECT DEFIB PAD ADLT CADENCE (PAD) IMPLANT
GLIDESHEATH SLEND SS 6F .021 (SHEATH) IMPLANT
GUIDEWIRE INQWIRE 1.5J.035X260 (WIRE) IMPLANT
INQWIRE 1.5J .035X260CM (WIRE) ×1
KIT ENCORE 26 ADVANTAGE (KITS) IMPLANT
KIT HEART LEFT (KITS) ×1 IMPLANT
PACK CARDIAC CATHETERIZATION (CUSTOM PROCEDURE TRAY) ×1 IMPLANT
SHEATH PROBE COVER 6X72 (BAG) IMPLANT
STENT SYNERGY XD 2.75X12 (Permanent Stent) IMPLANT
STENT SYNERGY XD 3.0X12 (Permanent Stent) IMPLANT
SYNERGY XD 2.75X12 (Permanent Stent) ×1 IMPLANT
SYNERGY XD 3.0X12 (Permanent Stent) ×1 IMPLANT
TRANSDUCER W/STOPCOCK (MISCELLANEOUS) ×1 IMPLANT
TUBING CIL FLEX 10 FLL-RA (TUBING) ×1 IMPLANT
WIRE ASAHI PROWATER 180CM (WIRE) IMPLANT

## 2022-08-25 NOTE — Progress Notes (Signed)
Discussed with pt stent, Plavix, restrictions, diet, exercise, NTG (pt doesn't carry) and CRPII. Receptive, will refer to [place] CRPII.  [id number] Ethelda Chick BS, ACSM-CEP 08/25/2022 12:31 PM

## 2022-08-25 NOTE — Interval H&P Note (Signed)
History and Physical Interval Note:  08/25/2022 7:43 AM  Miguel [name]  has presented today for surgery, with the diagnosis of cad - stable angina.  The various methods of treatment have been discussed with the patient and family. After consideration of risks, benefits and other options for treatment, the patient has consented to  Procedure(s): LEFT HEART CATH AND CORONARY ANGIOGRAPHY (N/A) as a surgical intervention.  The patient's history has been reviewed, patient examined, no change in status, stable for surgery.  I have reviewed the patient's chart and labs.  Questions were answered to the patient's satisfaction.   Cath Lab Visit (complete for each Cath Lab visit)  Clinical Evaluation Leading to the Procedure:   ACS: No.  Non-ACS:    Anginal Classification: CCS II  Anti-ischemic medical therapy: Maximal Therapy (2 or more classes of medications)  Non-Invasive Test Results: Intermediate-risk stress test findings: cardiac mortality 1-3%/year  Prior CABG: No previous CABG        Theron Arista [facility] 08/25/2022 7:43 AM

## 2022-08-25 NOTE — Progress Notes (Signed)
Patient and wife was given discharge instructions. Both verbalized understanding. 

## 2022-08-25 NOTE — Discharge Summary (Signed)
Discharge Summary for Same Day PCI   Patient ID: Miguel [name] MRN: [id number]; DOB: 06-Jan-1956  Admit date: 08/25/2022 Discharge date: 08/25/2022  Primary Care Provider: Kristian Covey, MD  Primary Cardiologist: Little Ishikawa, MD  Primary Electrophysiologist:  None   Discharge Diagnoses    Active Problems:   Chest pain  Diagnostic Studies/Procedures    Cardiac Catheterization 08/25/2022:    Prox RCA lesion is 20% stenosed.   Prox LAD to Mid LAD lesion is 60% stenosed.   A drug-eluting stent was successfully placed using a SYNERGY XD 2.75X12.   Post intervention, there is a 0% residual stenosis.   Prox LAD lesion is 60% stenosed.   A drug-eluting stent was successfully placed using a SYNERGY XD 3.0X12.   Post intervention, there is a 0% residual stenosis.   1st Mrg lesion is 20% stenosed.   The left ventricular systolic function is normal.   LV end diastolic pressure is normal.   The left ventricular ejection fraction is 55-65% by visual estimate.   In the absence of any other complications or medical issues, we expect the patient to be ready for discharge from an interventional cardiology perspective on 08/25/2022.   Recommend uninterrupted dual antiplatelet therapy with Aspirin 81mg  daily and Clopidogrel 75mg  daily for a minimum of 6 months (stable ischemic heart disease-Class I recommendation).   Single vessel obstructive CAD with tandem lesions in the proximal and mid LAD with abnormal FFR by Cath works analysis Normal LV function Normal LVEDP 13 mm  Hg Successful PCI of the proximal and mid LAD with DES x 2   Plan: DAPT for 6 months. Anticipate same day DC   Diagnostic Dominance: Right  Intervention   _____________   History of Present Illness     Miguel [name] is a 67 y.o. male with PMH of HTN, HLD who was recently seen in the office with Dr. Bjorn Pippin after being referred by his PCP for chest pain and dyspnea on exertion.   Chest pain  described as tightness in the center of the chest that lasts for 10 to 15 minutes and resolves with rest.  Family history included paternal uncle with MI at his 18s to 85s and cousin with MI at at age 10.  Patient underwent coronary CTA with FFR which showed calcium score of 2088 and possible flow-limiting lesion in the mid LAD.  On follow-up patient denied anginal symptoms.  He was started on metoprolol and provided with as needed SL NTG.  If angina developed would plan for cath  Patient was seen in the office by Dr. Bjorn Pippin on 12/03/2021 for routine follow-up.  He was doing well at that time and did not report any dyspnea or chest pain with yard work.  No medication changes were made.     He was seen back in the office on 6/17 with Miguel Levering, NP and reported central chest pain described as dull with or without exertion that could last hours before resolving. Typically resolved with rest. He had not taken NTG for it. He cannot reproduce his pain with movement or palpation. Pain was not necessarily associated with shortness of breath but he did report dyspnea with less exertion than previous. He remains very active doing moderate to heavy yard work and working full time for the [employer] team. No lower extremity edema, orthopnea, PND. It was recommended that the patient undergo cardiac cath for further work-up.  Hospital Course     The patient underwent cardiac cath  as noted above with single vessel obstructive CAD with tandem lesions in the p/mLAD. Successful PCI/DES x2 to p/mLAD. Plan for DAPT with ASA/plavix for at least 6 months. The patient was seen by cardiac rehab while in short stay. There were no observed complications post cath. Radial cath site was re-evaluated prior to discharge and found to be stable without any complications. Instructions/precautions regarding cath site care were given prior to discharge.  Miguel [name] was seen by Dr. [place] and determined stable for  discharge home. Follow up with our office has been arranged. Medications are listed below. Pertinent changes include addition of plavix, increased Crestor from 10 to 20mg  daily, switched PPI to protonix.  _____________  Cath/PCI Registry Performance & Quality Measures: Aspirin prescribed? - Yes ADP Receptor Inhibitor (Plavix/Clopidogrel, Brilinta/Ticagrelor or Effient/Prasugrel) prescribed (includes medically managed patients)? - Yes High Intensity Statin (Lipitor 40-80mg  or Crestor 20-40mg ) prescribed? - Yes For EF <40%, was ACEI/ARB prescribed? - Not Applicable (EF >/= 40%) For EF <40%, Aldosterone Antagonist (Spironolactone or Eplerenone) prescribed? - Not Applicable (EF >/= 40%) Cardiac Rehab Phase II ordered (Included Medically managed Patients)? - Yes  _____________   Discharge Vitals Blood pressure (!) 144/93, pulse (!) 56, temperature 99.5 F (37.5 C), resp. rate 18, height 5\' 10"  (1.778 m), weight 99.8 kg, SpO2 99 %.  Filed Weights   08/25/22 0721  Weight: 99.8 kg    Last Labs & Radiologic Studies    CBC No results for input(s): "WBC", "NEUTROABS", "HGB", "HCT", "MCV", "PLT" in the last 72 hours. Basic Metabolic Panel No results for input(s): "NA", "K", "CL", "CO2", "GLUCOSE", "BUN", "CREATININE", "CALCIUM", "MG", "PHOS" in the last 72 hours. Liver Function Tests No results for input(s): "AST", "ALT", "ALKPHOS", "BILITOT", "PROT", "ALBUMIN" in the last 72 hours. No results for input(s): "LIPASE", "AMYLASE" in the last 72 hours. High Sensitivity Troponin:   No results for input(s): "TROPONINIHS" in the last 720 hours.  BNP Invalid input(s): "POCBNP" D-Dimer No results for input(s): "DDIMER" in the last 72 hours. Hemoglobin A1C No results for input(s): "HGBA1C" in the last 72 hours. Fasting Lipid Panel No results for input(s): "CHOL", "HDL", "LDLCALC", "TRIG", "CHOLHDL", "LDLDIRECT" in the last 72 hours. Thyroid Function Tests No results for input(s): "TSH",  "T4TOTAL", "T3FREE", "THYROIDAB" in the last 72 hours.  Invalid input(s): "FREET3" _____________  CARDIAC CATHETERIZATION  Result Date: 08/25/2022   Prox RCA lesion is 20% stenosed.   Prox LAD to Mid LAD lesion is 60% stenosed.   A drug-eluting stent was successfully placed using a SYNERGY XD 2.75X12.   Post intervention, there is a 0% residual stenosis.   Prox LAD lesion is 60% stenosed.   A drug-eluting stent was successfully placed using a SYNERGY XD 3.0X12.   Post intervention, there is a 0% residual stenosis.   1st Mrg lesion is 20% stenosed.   The left ventricular systolic function is normal.   LV end diastolic pressure is normal.   The left ventricular ejection fraction is 55-65% by visual estimate.   In the absence of any other complications or medical issues, we expect the patient to be ready for discharge from an interventional cardiology perspective on 08/25/2022.   Recommend uninterrupted dual antiplatelet therapy with Aspirin 81mg  daily and Clopidogrel 75mg  daily for a minimum of 6 months (stable ischemic heart disease-Class I recommendation). Single vessel obstructive CAD with tandem lesions in the proximal and mid LAD with abnormal FFR by Cath works analysis Normal LV function Normal LVEDP 13 mm  Hg Successful PCI  of the proximal and mid LAD with DES x 2 Plan: DAPT for 6 months. Anticipate same day DC  CT Chest Wo Contrast  Result Date: 08/19/2022 CLINICAL DATA:  Follow-up lung nodules. Descending thoracic aortic aneurysm. EXAM: CT CHEST WITHOUT CONTRAST TECHNIQUE: Multidetector CT imaging of the chest was performed following the standard protocol without IV contrast. RADIATION DOSE REDUCTION: This exam was performed according to the departmental dose-optimization program which includes automated exposure control, adjustment of the mA and/or kV according to patient size and/or use of iterative reconstruction technique. COMPARISON:  06/30/2021 FINDINGS: Cardiovascular: Heart size normal.  Aortic atherosclerosis. Rim calcified aneurysm involving the posterior arch and proximal descending thoracic aorta measuring 4.2 cm, image 57/2. Formally this measured 4.1 cm. Coronary artery calcifications. No pericardial effusion. Mediastinum/Nodes: No enlarged mediastinal or axillary lymph nodes. Thyroid gland, trachea, and esophagus demonstrate no significant findings. Lungs/Pleura: No pleural effusion, airspace consolidation, atelectasis, or pneumothorax. -nodule within the left upper lobe with central calcification measuring 1 cm, image 59/5. This is unchanged when compared with the exam from 06/30/2021 (when remeasured). -Tiny nodules clustered in the periphery of the left lower lobe are unchanged compared with the previous exam. The largest measures 3 mm, image 101/5. Upper Abdomen: No acute abnormality. Musculoskeletal: Thoracic degenerative disc disease. No acute or suspicious osseous findings. IMPRESSION: 1. Stable appearance of 1 cm left upper lobe lung nodule with central calcification. This favored to represent a benign process such as developing granuloma. 2. Stable appearance of tiny nodules clustered in the periphery of the left lower lobe, likely inflammatory or infectious in etiology. 3. Rim calcified aneurysm involving the posterior arch and proximal descending thoracic measures 4.2 cm, formally 4.1 cm. Recommend semi-annual imaging followup by CTA or MRA and referral to cardiothoracic surgery if not already obtained. This recommendation follows 2010 ACCF/AHA/AATS/ACR/ASA/SCA/SCAI/SIR/STS/SVM Guidelines for the Diagnosis and Management of Patients With Thoracic Aortic Disease. Circulation. 2010; 121: S854-O27. Aortic aneurysm NOS (ICD10-I71.9) 4. Coronary artery calcifications noted. 5.  Aortic Atherosclerosis (ICD10-I70.0). Electronically Signed   By: Signa Kell M.D.   On: 08/19/2022 11:58    Disposition   Pt is being discharged home today in good condition.  Follow-up Plans &  Appointments     Follow-up Information     Miguel Levering, NP Follow up on 09/08/2022.   Specialty: Cardiology Why: at 2:45pm for your follow up appt Contact information: [address] [place] [address] [phone]                Discharge Instructions     Amb Referral to Cardiac Rehabilitation   Complete by: As directed    Diagnosis:  Coronary Stents PTCA     After initial evaluation and assessments completed: Virtual Based Care may be provided alone or in conjunction with Phase 2 Cardiac Rehab based on patient barriers.: Yes   Intensive Cardiac Rehabilitation (ICR) [facility] location only OR Traditional Cardiac Rehabilitation (TCR) *If criteria for ICR are not met will enroll in TCR [facility] only): Yes        Discharge Medications   Allergies as of 08/25/2022   No Known Allergies      Medication List     STOP taking these medications    omeprazole 20 MG capsule Commonly known as: PRILOSEC       TAKE these medications    amLODipine 5 MG tablet Commonly known as: NORVASC TAKE 1 TABLET (5 MG TOTAL) BY MOUTH DAILY. *APPOINTMENT NEEDED FOR FUTURE REFILLS   aspirin EC 81  MG tablet Take 81 mg by mouth daily.   clopidogrel 75 MG tablet Commonly known as: PLAVIX Take 1 tablet (75 mg total) by mouth daily with breakfast. Start taking on: August 26, 2022   dextromethorphan-guaiFENesin 30-600 MG 12hr tablet Commonly known as: MUCINEX DM Take 1 tablet by mouth 2 (two) times daily as needed for cough.   losartan 100 MG tablet Commonly known as: COZAAR TAKE 1 TABLET BY MOUTH EVERY DAY   metoprolol succinate 25 MG 24 hr tablet Commonly known as: TOPROL-XL TAKE 1 TABLET (25 MG TOTAL) BY MOUTH DAILY.   nitroGLYCERIN 0.4 MG SL tablet Commonly known as: NITROSTAT Place 0.4 mg under the tongue every 5 (five) minutes as needed for chest pain.   pantoprazole 40 MG tablet Commonly known as: Protonix Take 1 tablet (40 mg total) by mouth daily.    rosuvastatin 20 MG tablet Commonly known as: Crestor Take 1 tablet (20 mg total) by mouth daily. What changed:  medication strength how much to take          Allergies No Known Allergies  Outstanding Labs/Studies   FLP/LFTs in 8 weeks   Duration of Discharge Encounter   Greater than 30 minutes including physician time.  Signed, Laverda Page, NP 08/25/2022, 3:07 PM

## 2022-08-25 NOTE — Discharge Instructions (Signed)

## 2022-08-26 ENCOUNTER — Ambulatory Visit (INDEPENDENT_AMBULATORY_CARE_PROVIDER_SITE_OTHER): Payer: Medicare HMO | Admitting: Family Medicine

## 2022-08-26 ENCOUNTER — Encounter (HOSPITAL_COMMUNITY): Payer: Self-pay | Admitting: Cardiology

## 2022-08-26 DIAGNOSIS — Z0000 Encounter for general adult medical examination without abnormal findings: Secondary | ICD-10-CM | POA: Diagnosis not present

## 2022-08-26 NOTE — Patient Instructions (Signed)
I really enjoyed getting to talk with you today! I am available on Tuesdays and Thursdays for virtual visits if you have any questions or concerns, or if I can be of any further assistance.   CHECKLIST FROM ANNUAL WELLNESS VISIT:  -Follow up (please call to schedule if not scheduled after visit):   -yearly for annual wellness visit with primary care office  Here is a list of your preventive care/health maintenance measures and the plan for each if any are due:  PLAN For any measures below that may be due:   Health Maintenance  Topic Date Due   COVID-19 Vaccine (1) Never done   Zoster Vaccines- Shingrix (1 of 2) 11/26/2022 (Originally 04/18/2005)   INFLUENZA VACCINE  09/30/2022   Medicare Annual Wellness (AWV)  08/26/2023   Fecal DNA (Cologuard)  04/27/2024   DTaP/Tdap/Td (3 - Td or Tdap) 05/06/2025   Pneumonia Vaccine 78+ Years old  Completed   Hepatitis C Screening  Completed   HPV VACCINES  Aged Out   Colonoscopy  Discontinued    -See a dentist at least yearly  -Get your eyes checked and then per your eye specialist's recommendations  -Other issues addressed today:   -I have included below further information regarding a healthy whole foods based diet, physical activity guidelines for adults, stress management and opportunities for social connections. I hope you find this information useful.   -----------------------------------------------------------------------------------------------------------------------------------------------------------------------------------------------------------------------------------------------------------  NUTRITION: -eat real food: lots of colorful vegetables (half the plate) and fruits -5-7 servings of vegetables and fruits per day (fresh or steamed is best), exp. 2 servings of vegetables with lunch and dinner and 2 servings of fruit per day. Berries and greens such as kale and collards are great choices.  -consume on a regular basis:  whole grains (make sure first ingredient on label contains the word "whole"), fresh fruits, fish, nuts, seeds, healthy oils (such as olive oil, avocado oil, grape seed oil) -may eat small amounts of dairy and lean meat on occasion, but avoid processed meats such as ham, bacon, lunch meat, etc. -drink water -try to avoid fast food and pre-packaged foods, processed meat -most experts advise limiting sodium to < 2300mg  per day, should limit further is any chronic conditions such as high blood pressure, heart disease, diabetes, etc. The American Heart Association advised that < 1500mg  is is ideal -try to avoid foods that contain any ingredients with names you do not recognize  -try to avoid sugar/sweets (except for the natural sugar that occurs in fresh fruit) -try to avoid sweet drinks -try to avoid white rice, white bread, pasta (unless whole grain), white or yellow potatoes  EXERCISE GUIDELINES FOR ADULTS: -if you wish to increase your physical activity, do so gradually and with the approval of your doctor -STOP and seek medical care immediately if you have any chest pain, chest discomfort or trouble breathing when starting or increasing exercise  -move and stretch your body, legs, feet and arms when sitting for long periods -Physical activity guidelines for optimal health in adults: -least 150 minutes per week of aerobic exercise (can talk, but not sing) once approved by your doctor, 20-30 minutes of sustained activity or two 10 minute episodes of sustained activity every day.  -resistance training at least 2 days per week if approved by your doctor -balance exercises 3+ days per week:   Stand somewhere where you have something sturdy to hold onto if you lose balance.    1) lift up on toes, start with 5x per  day and work up to 20x   2) stand and lift on leg straight out to the side so that foot is a few inches of the floor, start with 5x each side and work up to 20x each side   3) stand on one  foot, start with 5 seconds each side and work up to 20 seconds on each side  If you need ideas or help with getting more active:  -Silver sneakers https://tools.silversneakers.com  -Walk with a Doc: http://www.duncan-williams.com/  -try to include resistance (weight lifting/strength building) and balance exercises twice per week: or the following link for ideas: http://castillo-powell.com/  BuyDucts.dk  STRESS MANAGEMENT: -can try meditating, or just sitting quietly with deep breathing while intentionally relaxing all parts of your body for 5 minutes daily -if you need further help with stress, anxiety or depression please follow up with your primary doctor or contact the wonderful folks at [facility]: [phone]  SOCIAL CONNECTIONS: -options in [place] if you wish to engage in more social and exercise related activities:  -Silver sneakers https://tools.silversneakers.com  -Walk with a Doc: http://www.duncan-williams.com/  -Check out the [facility] Active Adults 50+ section on the [place] [employer] (hiking clubs, book clubs, cards and games, chess, exercise classes, aquatic classes and much more) - see the website for details: https://www.[place].gov/departments/parks-recreation/active-adults50  -YouTube has lots of exercise videos for different ages and abilities as well  -[facility] (a variety of indoor and outdoor inperson activities for adults). 2036087999. [address]  -Virtual Online Classes (a variety of topics): see seniorplanet.org or call [phone]  -consider volunteering at a school, hospice center, church, senior center or elsewhere

## 2022-08-26 NOTE — Progress Notes (Signed)
PATIENT CHECK-IN and HEALTH RISK ASSESSMENT QUESTIONNAIRE:  -completed by phone/video for upcoming Medicare Preventive Visit  Pre-Visit Check-in: 1)Vitals (height, wt, BP, etc) - record in vitals section for visit on day of visit 2)Review and Update Medications, Allergies PMH, Surgeries, Social history in Epic 3)Hospitalizations in the last year with date/reason? no  4)Review and Update Care Team (patient's specialists) in Epic 5) Complete PHQ9 in Epic  6) Complete Fall Screening in Epic 7)Review all Health Maintenance Due and order under PCP if not done.  Medicare Wellness Patient Questionnaire:  Answer theses question about your habits: Do you drink alcohol? yes If yes, how many drinks do you have a day?occasionally Have you ever smoked?no Quit date if applicable? N/A  How many packs a day do/did you smoke? N/A Do you use smokeless tobacco?no Do you use an illicit drugs?no Do you exercises? No  Are you sexually active? No Number of partners? Typical breakfast-coffee, cereal, muffin or fruit Typical lunch-sandwiches Typical dinner-variety of beef, chicken or veal Typical snacks: varies-whatever he has in the house **Beverages: tea, coffee, Coke, water  Answer theses question about you: Can you perform most household chores?yes Do you find it hard to follow a conversation in a noisy room?no Do you often ask people to speak up or repeat themselves?no Do you feel that you have a problem with memory?no Do you balance your checkbook and or bank accounts? yes Do you feel safe at home?yes Last dentist visit?4-5 months Do you need assistance with any of the following: Please note if so   Driving?  Feeding yourself? no  Getting from bed to chair? no  Getting to the toilet? no  Bathing or showering? no  Dressing yourself? no  Managing money? no  Climbing a flight of stairs-no  Preparing meals? no    Do you have Advanced Directives in place (Living Will, Healthcare Power or  [place])? yes   Last eye Exam and location?Dr Kinnie Feil per patient   Do you currently use prescribed or non-prescribed narcotic or opioid pain medications?no  Do you have a history or close family history of breast, ovarian, tubal or peritoneal cancer or a family member with BRCA (breast cancer susceptibility 1 and 2) gene mutations? Yes-sister and Mother  Nurse/Assistant Credentials/time stamp: Mellody Drown   ----------------------------------------------------------------------------------------------------------------------------------------------------------------------------------------------------------------------    MEDICARE ANNUAL PREVENTIVE CARE VISIT WITH PROVIDER (Welcome to [facility], initial annual wellness or annual wellness exam)  Virtual Visit via Video Note  I connected with Virl Coble on 08/26/22  by a video enabled telemedicine application and verified that I am speaking with the correct person using two identifiers.  Location patient: home Location provider:work or home office Persons participating in the virtual visit: patient, provider  Concerns and/or follow up today: doing good overall. Reports had a cath recently and is recovering. Cardiologist told him to start walking/exercising today. He plans to start walking. He struggles from chronic R ankle pain - pretty much all the time. Hx of injury decades ago. He plans to get it checked out but hasn't yet.    See HM section in Epic for other details of completed HM.    ROS: negative for report of fevers, unintentional weight loss, vision changes, vision loss, hearing loss or change, chest pain, sob, hemoptysis, melena, hematochezia, hematuria, falls, bleeding or bruising, thoughts of suicide or self harm, memory loss  Patient-completed extensive health risk assessment - reviewed and discussed with the patient: See Health Risk Assessment completed with patient prior to the visit  either  above or in recent phone note. This was reviewed in detailed with the patient today and appropriate recommendations, orders and referrals were placed as needed per Summary below and patient instructions.   Review of Medical History: -PMH, PSH, Family History and current specialty and care providers reviewed and updated and listed below   Patient Care Team: Kristian Covey, MD as PCP - General (Family Medicine) Little Ishikawa, MD as PCP - Cardiology (Cardiology)   Past Medical History:  Diagnosis Date   Chicken pox    Family history of breast cancer    GERD (gastroesophageal reflux disease)    Hypertension     Past Surgical History:  Procedure Laterality Date   CORONARY IMAGING/OCT N/A 08/25/2022   Procedure: CORONARY IMAGING/OCT;  Surgeon: Swaziland, Peter M, MD;  Location: [facility] INVASIVE CV LAB;  Service: Cardiovascular;  Laterality: N/A;   CORONARY STENT INTERVENTION N/A 08/25/2022   Procedure: CORONARY STENT INTERVENTION;  Surgeon: Swaziland, Peter M, MD;  Location: [facility] INVASIVE CV LAB;  Service: Cardiovascular;  Laterality: N/A;   LEFT HEART CATH AND CORONARY ANGIOGRAPHY N/A 08/25/2022   Procedure: LEFT HEART CATH AND CORONARY ANGIOGRAPHY;  Surgeon: Swaziland, Peter M, MD;  Location: [facility] INVASIVE CV LAB;  Service: Cardiovascular;  Laterality: N/A;   MANDIBLE SURGERY  1980   nose repair  1970   deviated septum   REPAIR ANKLE LIGAMENT  1975   REPAIR KNEE LIGAMENT  1980    accident   TONSILLECTOMY      Social History   Socioeconomic History   Marital status: Married    Spouse name: Not on file   Number of children: Not on file   Years of education: Not on file   Highest education level: Not on file  Occupational History   Not on file  Tobacco Use   Smoking status: Never   Smokeless tobacco: Never  Vaping Use   Vaping Use: Never used  Substance and Sexual Activity   Alcohol use: No   Drug use: No   Sexual activity: Not on file  Other Topics Concern   Not on file   Social History Narrative   Not on file   Social Determinants of Health   Financial Resource Strain: Low Risk  (08/26/2022)   Overall Financial Resource Strain (CARDIA)    Difficulty of Paying Living Expenses: Not hard at all  Food Insecurity: No Food Insecurity (08/26/2022)   Hunger Vital Sign    Worried About Running Out of Food in the Last Year: Never true    Ran Out of Food in the Last Year: Never true  Transportation Needs: No Transportation Needs (08/26/2022)   PRAPARE - Administrator, Civil Service (Medical): No    Lack of Transportation (Non-Medical): No  Physical Activity: Inactive (08/26/2022)   Exercise Vital Sign    Days of Exercise per Week: 0 days    Minutes of Exercise per Session: 0 min  Stress: No Stress Concern Present (08/26/2022)   [employer] of Occupational Health - Occupational Stress Questionnaire    Feeling of Stress : Not at all  Social Connections: Socially Integrated (08/26/2022)   Social Connection and Isolation Panel [NHANES]    Frequency of Communication with Friends and Family: More than three times a week    Frequency of Social Gatherings with Friends and Family: Once a week    Attends Religious Services: More than 4 times per year    Active Member of Clubs or Organizations: No  Attends Banker Meetings: More than 4 times per year    Marital Status: Married  Catering manager Violence: Not At Risk (08/26/2022)   Humiliation, Afraid, Rape, and Kick questionnaire    Fear of Current or Ex-Partner: No    Emotionally Abused: No    Physically Abused: No    Sexually Abused: No    Family History  Problem Relation Age of Onset   Hyperlipidemia Mother    Hypertension Mother    Breast cancer Mother        dx in her 32s; BRCA2+   Hyperlipidemia Father    Hypertension Father    Diabetes Sister        type 2   Hyperlipidemia Sister    Heart disease Sister    Brain cancer Maternal Aunt        brain stem   Prostate cancer  Maternal Uncle    Heart disease Paternal Uncle    Heart attack Maternal Grandfather    Breast cancer Sister 45       BRCA2+   Breast cancer Cousin        mat first cousin   Breast cancer Cousin        mat first cousin   Breast cancer Other        MGM's sister   Pancreatic cancer Maternal Great-grandmother    Breast cancer Other        MGMs sister   Breast cancer Cousin        mother's pat first cousin   Breast cancer Cousin        pt's pat second cousin, once removed - BRCA pos   Colon cancer Neg Hx    Stomach cancer Neg Hx     Current Outpatient Medications on File Prior to Visit  Medication Sig Dispense Refill   amLODipine (NORVASC) 5 MG tablet TAKE 1 TABLET (5 MG TOTAL) BY MOUTH DAILY. *APPOINTMENT NEEDED FOR FUTURE REFILLS 90 tablet 1   aspirin EC 81 MG tablet Take 81 mg by mouth daily.     clopidogrel (PLAVIX) 75 MG tablet Take 1 tablet (75 mg total) by mouth daily with breakfast. 30 tablet 5   dextromethorphan-guaiFENesin (MUCINEX DM) 30-600 MG 12hr tablet Take 1 tablet by mouth 2 (two) times daily as needed for cough.     losartan (COZAAR) 100 MG tablet TAKE 1 TABLET BY MOUTH EVERY DAY 90 tablet 3   metoprolol succinate (TOPROL-XL) 25 MG 24 hr tablet TAKE 1 TABLET (25 MG TOTAL) BY MOUTH DAILY. 90 tablet 1   nitroGLYCERIN (NITROSTAT) 0.4 MG SL tablet Place 0.4 mg under the tongue every 5 (five) minutes as needed for chest pain.     pantoprazole (PROTONIX) 40 MG tablet Take 1 tablet (40 mg total) by mouth daily. 30 tablet 5   rosuvastatin (CRESTOR) 20 MG tablet Take 1 tablet (20 mg total) by mouth daily. 90 tablet 1   No current facility-administered medications on file prior to visit.    No Known Allergies     Physical Exam There were no vitals filed for this visit. Estimated body mass index is 31.57 kg/m as calculated from the following:   Height as of 08/25/22: 5\' 10"  (1.778 m).   Weight as of 08/25/22: 220 lb (99.8 kg).  EKG (optional): deferred due to virtual  visit  GENERAL: alert, oriented, no acute distress detected; full vision exam deferred due to pandemic and/or virtual encounter  HEENT: atraumatic, conjunttiva clear, no obvious abnormalities on inspection  of external nose and ears  NECK: normal movements of the head and neck  LUNGS: on inspection no signs of respiratory distress, breathing rate appears normal, no obvious gross SOB, gasping or wheezing  CV: no obvious cyanosis  MS: moves all visible extremities without noticeable abnormality  PSYCH/NEURO: pleasant and cooperative, no obvious depression or anxiety, speech and thought processing grossly intact, Cognitive function grossly intact  Flowsheet Row Office Visit from 08/26/2022 in [facility] [place]  PHQ-9 Total Score 1           08/26/2022    2:11 PM 04/20/2022    8:04 AM 08/27/2021    8:55 AM 04/17/2021    8:00 AM 06/04/2019   10:04 AM  Depression screen PHQ 2/9  Decreased Interest 0 0 0 0 0  Down, Depressed, Hopeless 0 0 0 0 0  PHQ - 2 Score 0 0 0 0 0  Altered sleeping 0      Tired, decreased energy 1      Change in appetite 0      Feeling bad or failure about yourself  0      Trouble concentrating 0      Moving slowly or fidgety/restless 0      Suicidal thoughts 0      PHQ-9 Score 1           08/24/2021    2:18 PM 08/27/2021    8:58 AM 04/20/2022    8:04 AM 08/24/2022    9:02 AM 08/26/2022    2:11 PM  Fall Risk  Falls in the past year? 0 0 0 0 0  Was there an injury with Fall?  0 0 0 0  Fall Risk Category Calculator  0 0 0 0  Fall Risk Category (Retired)  Low     (RETIRED) Patient Fall Risk Level  Low fall risk     Patient at Risk for Falls Due to  No Fall Risks No Fall Risks  No Fall Risks  Fall risk Follow up   Falls evaluation completed  Falls evaluation completed     SUMMARY AND PLAN:  Encounter for Medicare annual wellness exam   Discussed applicable health maintenance/preventive health measures and advised and referred  or ordered per patient preferences: -discussed vaccines due and recs -he is doing the cologuard test rather than the colonoscopy - updated in epid -for his ankle advised inperson eval with PCP or ortho ucc (provided several options)  Health Maintenance  Topic Date Due   COVID-19 Vaccine (1) Never done   Zoster Vaccines- Shingrix (1 of 2) 11/26/2022 (Originally 04/18/2005)   INFLUENZA VACCINE  09/30/2022   Medicare Annual Wellness (AWV)  08/26/2023   Fecal DNA (Cologuard)  04/27/2024   DTaP/Tdap/Td (3 - Td or Tdap) 05/06/2025   Pneumonia Vaccine 68+ Years old  Completed   Hepatitis C Screening  Completed   HPV VACCINES  Aged Out   Colonoscopy  Discontinued   Education and counseling on the following was provided based on the above review of health and a plan/checklist for the patient, along with additional information discussed, was provided for the patient in the patient instructions :   -Provided  safe balance exercises that can be done at home to improve balance and discussed exercise guidelines for adults with include balance exercises at least 3 days per week.  -Advised and counseled on a healthy lifestyle - including the importance of a healthy diet, regular physical activity, social connections and stress  management. -Reviewed patient's current diet. Advised and counseled on a whole foods based healthy diet. A summary of a healthy diet was provided in the Patient Instructions.  -reviewed patient's current physical activity level and discussed exercise guidelines for adults. Discussed community resources and ideas for safe exercise at home to assist in meeting exercise guideline recommendations in a safe and healthy way.  -Advise yearly dental visits at minimum and regular eye exams -Advised and counseled on alcohol safe limits, risks  Follow up: see patient instructions   Patient Instructions  I really enjoyed getting to talk with you today! I am available on Tuesdays and  Thursdays for virtual visits if you have any questions or concerns, or if I can be of any further assistance.   CHECKLIST FROM ANNUAL WELLNESS VISIT:  -Follow up (please call to schedule if not scheduled after visit):   -yearly for annual wellness visit with primary care office  Here is a list of your preventive care/health maintenance measures and the plan for each if any are due:  PLAN For any measures below that may be due:   Health Maintenance  Topic Date Due   COVID-19 Vaccine (1) Never done   Zoster Vaccines- Shingrix (1 of 2) 11/26/2022 (Originally 04/18/2005)   INFLUENZA VACCINE  09/30/2022   Medicare Annual Wellness (AWV)  08/26/2023   Fecal DNA (Cologuard)  04/27/2024   DTaP/Tdap/Td (3 - Td or Tdap) 05/06/2025   Pneumonia Vaccine 71+ Years old  Completed   Hepatitis C Screening  Completed   HPV VACCINES  Aged Out   Colonoscopy  Discontinued    -See a dentist at least yearly  -Get your eyes checked and then per your eye specialist's recommendations  -Other issues addressed today:   -I have included below further information regarding a healthy whole foods based diet, physical activity guidelines for adults, stress management and opportunities for social connections. I hope you find this information useful.   -----------------------------------------------------------------------------------------------------------------------------------------------------------------------------------------------------------------------------------------------------------  NUTRITION: -eat real food: lots of colorful vegetables (half the plate) and fruits -5-7 servings of vegetables and fruits per day (fresh or steamed is best), exp. 2 servings of vegetables with lunch and dinner and 2 servings of fruit per day. Berries and greens such as kale and collards are great choices.  -consume on a regular basis: whole grains (make sure first ingredient on label contains the word "whole"), fresh  fruits, fish, nuts, seeds, healthy oils (such as olive oil, avocado oil, grape seed oil) -may eat small amounts of dairy and lean meat on occasion, but avoid processed meats such as ham, bacon, lunch meat, etc. -drink water -try to avoid fast food and pre-packaged foods, processed meat -most experts advise limiting sodium to < 2300mg  per day, should limit further is any chronic conditions such as high blood pressure, heart disease, diabetes, etc. The American Heart Association advised that < 1500mg  is is ideal -try to avoid foods that contain any ingredients with names you do not recognize  -try to avoid sugar/sweets (except for the natural sugar that occurs in fresh fruit) -try to avoid sweet drinks -try to avoid white rice, white bread, pasta (unless whole grain), white or yellow potatoes  EXERCISE GUIDELINES FOR ADULTS: -if you wish to increase your physical activity, do so gradually and with the approval of your doctor -STOP and seek medical care immediately if you have any chest pain, chest discomfort or trouble breathing when starting or increasing exercise  -move and stretch your body, legs, feet and arms  when sitting for long periods -Physical activity guidelines for optimal health in adults: -least 150 minutes per week of aerobic exercise (can talk, but not sing) once approved by your doctor, 20-30 minutes of sustained activity or two 10 minute episodes of sustained activity every day.  -resistance training at least 2 days per week if approved by your doctor -balance exercises 3+ days per week:   Stand somewhere where you have something sturdy to hold onto if you lose balance.    1) lift up on toes, start with 5x per day and work up to 20x   2) stand and lift on leg straight out to the side so that foot is a few inches of the floor, start with 5x each side and work up to 20x each side   3) stand on one foot, start with 5 seconds each side and work up to 20 seconds on each side  If you  need ideas or help with getting more active:  -Silver sneakers https://tools.silversneakers.com  -Walk with a Doc: http://www.duncan-williams.com/  -try to include resistance (weight lifting/strength building) and balance exercises twice per week: or the following link for ideas: http://castillo-powell.com/  BuyDucts.dk  STRESS MANAGEMENT: -can try meditating, or just sitting quietly with deep breathing while intentionally relaxing all parts of your body for 5 minutes daily -if you need further help with stress, anxiety or depression please follow up with your primary doctor or contact the wonderful folks at [facility]: [phone]  SOCIAL CONNECTIONS: -options in [place] if you wish to engage in more social and exercise related activities:  -Silver sneakers https://tools.silversneakers.com  -Walk with a Doc: http://www.duncan-williams.com/  -Check out the [facility] Active Adults 50+ section on the [place] of [employer] (hiking clubs, book clubs, cards and games, chess, exercise classes, aquatic classes and much more) - see the website for details: https://www.[place].gov/departments/parks-recreation/active-adults50  -YouTube has lots of exercise videos for different ages and abilities as well  -[facility] (a variety of indoor and outdoor inperson activities for adults). [phone]. [address]  -Virtual Online Classes (a variety of topics): see seniorplanet.org or call [phone]  -consider volunteering at a school, hospice center, church, senior center or elsewhere           Terressa Koyanagi, DO

## 2022-08-31 ENCOUNTER — Telehealth (HOSPITAL_COMMUNITY): Payer: Self-pay

## 2022-08-31 NOTE — Telephone Encounter (Signed)
Called patient about his referral to see if he was interested in participating in the Cardiac Rehab Program. Patient expressed he is very interested! I explained the process for after his follow up and he said he understands.
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
## 2022-09-08 ENCOUNTER — Encounter: Payer: Self-pay | Admitting: Student

## 2022-09-08 ENCOUNTER — Ambulatory Visit: Payer: Medicare HMO | Attending: Student | Admitting: Student

## 2022-09-08 VITALS — BP 122/70 | HR 66 | Ht 70.0 in | Wt 220.8 lb

## 2022-09-08 DIAGNOSIS — I10 Essential (primary) hypertension: Secondary | ICD-10-CM | POA: Diagnosis not present

## 2022-09-08 DIAGNOSIS — Z955 Presence of coronary angioplasty implant and graft: Secondary | ICD-10-CM

## 2022-09-08 DIAGNOSIS — I25118 Atherosclerotic heart disease of native coronary artery with other forms of angina pectoris: Secondary | ICD-10-CM | POA: Diagnosis not present

## 2022-09-08 DIAGNOSIS — I7123 Aneurysm of the descending thoracic aorta, without rupture: Secondary | ICD-10-CM | POA: Diagnosis not present

## 2022-09-08 DIAGNOSIS — E785 Hyperlipidemia, unspecified: Secondary | ICD-10-CM | POA: Diagnosis not present

## 2022-09-08 NOTE — Patient Instructions (Signed)
Medication Instructions:  Your physician recommends that you continue on your current medications as directed. Please refer to the Current Medication list given to you today.   If you need a refill on your cardiac medications before your next appointment, please call your pharmacy  Lab Work: NONE If you have labs (blood work) drawn today and your tests are completely normal, you will receive your results only by: MyChart Message (if you have MyChart) OR A paper copy in the mail If you have any lab test that is abnormal or we need to change your treatment, we will call you to review the results.  Testing/Procedures: NONE  Follow-Up: At [facility], you and your health needs are our priority.  As part of our continuing mission to provide you with exceptional heart care, we have created designated Provider Care Teams.  These Care Teams include your primary Cardiologist (physician) and Advanced Practice Providers (APPs -  Physician Assistants and Nurse Practitioners) who all work together to provide you with the care you need, when you need it.  Your next appointment:   4 month(s)  Provider:   Little Ishikawa, MD

## 2022-09-09 ENCOUNTER — Other Ambulatory Visit (HOSPITAL_COMMUNITY): Payer: Self-pay

## 2022-09-26 ENCOUNTER — Other Ambulatory Visit: Payer: Self-pay | Admitting: Family Medicine

## 2022-12-03 DIAGNOSIS — K219 Gastro-esophageal reflux disease without esophagitis: Secondary | ICD-10-CM | POA: Diagnosis not present

## 2022-12-03 DIAGNOSIS — Z7902 Long term (current) use of antithrombotics/antiplatelets: Secondary | ICD-10-CM | POA: Diagnosis not present

## 2022-12-03 DIAGNOSIS — I719 Aortic aneurysm of unspecified site, without rupture: Secondary | ICD-10-CM | POA: Diagnosis not present

## 2022-12-03 DIAGNOSIS — E669 Obesity, unspecified: Secondary | ICD-10-CM | POA: Diagnosis not present

## 2022-12-03 DIAGNOSIS — Z7982 Long term (current) use of aspirin: Secondary | ICD-10-CM | POA: Diagnosis not present

## 2022-12-03 DIAGNOSIS — R7303 Prediabetes: Secondary | ICD-10-CM | POA: Diagnosis not present

## 2022-12-03 DIAGNOSIS — Z6831 Body mass index (BMI) 31.0-31.9, adult: Secondary | ICD-10-CM | POA: Diagnosis not present

## 2022-12-03 DIAGNOSIS — Z809 Family history of malignant neoplasm, unspecified: Secondary | ICD-10-CM | POA: Diagnosis not present

## 2022-12-03 DIAGNOSIS — Z8249 Family history of ischemic heart disease and other diseases of the circulatory system: Secondary | ICD-10-CM | POA: Diagnosis not present

## 2022-12-03 DIAGNOSIS — E785 Hyperlipidemia, unspecified: Secondary | ICD-10-CM | POA: Diagnosis not present

## 2022-12-03 DIAGNOSIS — I25119 Atherosclerotic heart disease of native coronary artery with unspecified angina pectoris: Secondary | ICD-10-CM | POA: Diagnosis not present

## 2022-12-03 DIAGNOSIS — I10 Essential (primary) hypertension: Secondary | ICD-10-CM | POA: Diagnosis not present

## 2023-01-16 ENCOUNTER — Other Ambulatory Visit: Payer: Self-pay | Admitting: Family Medicine

## 2023-02-03 ENCOUNTER — Telehealth: Payer: Self-pay | Admitting: Cardiology

## 2023-02-03 MED ORDER — PANTOPRAZOLE SODIUM 40 MG PO TBEC: 40.0000 mg | DELAYED_RELEASE_TABLET | Freq: Every day | ORAL | 2 refills | Status: DC

## 2023-02-03 MED ORDER — CLOPIDOGREL BISULFATE 75 MG PO TABS: 75.0000 mg | ORAL_TABLET | Freq: Every day | ORAL | 2 refills | Status: DC

## 2023-02-03 NOTE — Telephone Encounter (Signed)
*  STAT* If patient is at the pharmacy, call can be transferred to refill team.   1. Which medications need to be refilled? (please list name of each medication and dose if known)   clopidogrel (PLAVIX) 75 MG tablet     pantoprazole (PROTONIX) 40 MG tablet    2. Which pharmacy/location (including street and city if local pharmacy) is medication to be sent to? [id number] - [place], [place] - [address] [place] [address]   3. Do they need a 30 day or 90 day supply? 90

## 2023-02-03 NOTE — Telephone Encounter (Signed)
 RX sent to requested Pharmacy

## 2023-02-16 ENCOUNTER — Other Ambulatory Visit: Payer: Self-pay | Admitting: Cardiology

## 2023-02-16 ENCOUNTER — Other Ambulatory Visit: Payer: Self-pay | Admitting: Family Medicine

## 2023-02-16 NOTE — Telephone Encounter (Signed)
Filled at Cath Lab. Are we managing? Please review. Thanks
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
## 2023-02-24 ENCOUNTER — Ambulatory Visit: Payer: Medicare HMO | Attending: Cardiology | Admitting: Cardiology

## 2023-02-24 ENCOUNTER — Encounter: Payer: Self-pay | Admitting: Cardiology

## 2023-02-24 VITALS — BP 122/70 | HR 65 | Ht 70.0 in | Wt 226.0 lb

## 2023-02-24 DIAGNOSIS — E785 Hyperlipidemia, unspecified: Secondary | ICD-10-CM | POA: Diagnosis not present

## 2023-02-24 DIAGNOSIS — I10 Essential (primary) hypertension: Secondary | ICD-10-CM

## 2023-02-24 DIAGNOSIS — I719 Aortic aneurysm of unspecified site, without rupture: Secondary | ICD-10-CM

## 2023-02-24 DIAGNOSIS — I2510 Atherosclerotic heart disease of native coronary artery without angina pectoris: Secondary | ICD-10-CM

## 2023-02-24 MED ORDER — ROSUVASTATIN CALCIUM 10 MG PO TABS: 10.0000 mg | ORAL_TABLET | Freq: Every day | ORAL | 3 refills | Status: DC

## 2023-02-24 MED ORDER — ROSUVASTATIN CALCIUM 20 MG PO TABS: 20.0000 mg | ORAL_TABLET | Freq: Every day | ORAL | 3 refills | Status: DC

## 2023-02-24 NOTE — Patient Instructions (Addendum)
Medication Instructions:  Your physician has recommended you make the following change in your medication:  DECREASE: Crestor 10 mg once daily *If you need a refill on your cardiac medications before your next appointment, please call your pharmacy*   Lab Work: Please sent [place] a message when you get labs drawn at your primary care.   Follow-Up: At [facility], you and your health needs are our priority.  As part of our continuing mission to provide you with exceptional heart care, we have created designated Provider Care Teams.  These Care Teams include your primary Cardiologist (physician) and Advanced Practice Providers (APPs -  Physician Assistants and Nurse Practitioners) who all work together to provide you with the care you need, when you need it.   Your next appointment:   6 month(s)  Provider:   Little Ishikawa, MD

## 2023-04-05 ENCOUNTER — Ambulatory Visit (INDEPENDENT_AMBULATORY_CARE_PROVIDER_SITE_OTHER): Payer: Medicare HMO | Admitting: Family Medicine

## 2023-04-05 ENCOUNTER — Ambulatory Visit: Payer: Medicare HMO

## 2023-04-05 VITALS — BP 122/76 | HR 65 | Temp 98.3°F | Ht 70.0 in | Wt 228.0 lb

## 2023-04-05 DIAGNOSIS — R059 Cough, unspecified: Secondary | ICD-10-CM | POA: Diagnosis not present

## 2023-04-05 DIAGNOSIS — K219 Gastro-esophageal reflux disease without esophagitis: Secondary | ICD-10-CM | POA: Diagnosis not present

## 2023-04-05 DIAGNOSIS — R42 Dizziness and giddiness: Secondary | ICD-10-CM | POA: Diagnosis not present

## 2023-04-05 DIAGNOSIS — I10 Essential (primary) hypertension: Secondary | ICD-10-CM | POA: Diagnosis not present

## 2023-04-05 DIAGNOSIS — I7122 Aneurysm of the aortic arch, without rupture: Secondary | ICD-10-CM | POA: Diagnosis not present

## 2023-04-05 NOTE — Patient Instructions (Signed)
Consider elevate head of bed 4 to 6 inches.

## 2023-04-05 NOTE — Progress Notes (Signed)
 Established Patient Office Visit  Subjective   Patient ID: Miguel [name], male    DOB: [birth year]  Age: 68 y.o. MRN: [id number]  Chief Complaint  Patient presents with   Cough    Cough and congestion, mucus-yellow/green with dizziness, started 3 months ago     HPI   Miguel [name] is seen with intermittent cough for the past 3 months.  Occasionally productive of yellow-green mucus but mostly dry.  No fever.  No dyspnea.  Denies any chronic sinusitis symptoms.  Not aware of any wheezing.  No postnasal drip symptoms.  He does have history of GERD and takes Protonix  40 mg daily.  Still has occasional breakthrough symptoms and sometimes specially with laying flat.  Has not elevated head of bed but does sleep with 2 pillows.  Denies any recent appetite or weight changes.  No hemoptysis.  No history of smoking.  No history of asthma.  No ACE inhibitor use.  He does have CAD with couple of heart stents last year.  Due for follow-up labs.  Scheduled for physical in a couple weeks and plans to get labs then.  He has had some recent mild dizziness which is more of a lightheadedness sometimes with position change.  Couple episodes of possible vertigo with rapid head movement.  Has been little bit lightheaded today.  No syncope.  No palpitations.  No recent chest pains.  Medications reviewed.  He admits that he is not drinking enough water generally.  Lightheadedness usually is related to going from seated to standing.  Past Medical History:  Diagnosis Date   Chicken pox    Family history of breast cancer    GERD (gastroesophageal reflux disease)    Hypertension    Past Surgical History:  Procedure Laterality Date   CORONARY IMAGING/OCT N/A 08/25/2022   Procedure: CORONARY IMAGING/OCT;  Surgeon: Miguel [name], Miguel M, MD;  Location: [facility] INVASIVE CV LAB;  Service: Cardiovascular;  Laterality: N/A;   CORONARY STENT INTERVENTION N/A 08/25/2022   Procedure: CORONARY STENT INTERVENTION;  Surgeon: Miguel [name], Miguel  M, MD;  Location: [facility] INVASIVE CV LAB;  Service: Cardiovascular;  Laterality: N/A;   LEFT HEART CATH AND CORONARY ANGIOGRAPHY N/A 08/25/2022   Procedure: LEFT HEART CATH AND CORONARY ANGIOGRAPHY;  Surgeon: Miguel [name], Miguel M, MD;  Location: [facility] INVASIVE CV LAB;  Service: Cardiovascular;  Laterality: N/A;   MANDIBLE SURGERY  1980   nose repair  1970   deviated septum   REPAIR ANKLE LIGAMENT  1975   REPAIR KNEE LIGAMENT  1980    accident   TONSILLECTOMY      reports that he has never smoked. He has never used smokeless tobacco. He reports that he does not drink alcohol and does not use drugs. family history includes Brain cancer in his maternal aunt; Breast cancer in his cousin, cousin, cousin, cousin, mother, and other family members; Breast cancer (age of onset: 53) in his sister; Diabetes in his sister; Heart attack in his maternal grandfather; Heart disease in his paternal uncle and sister; Hyperlipidemia in his father, mother, and sister; Hypertension in his father and mother; Pancreatic cancer in his maternal great-grandmother; Prostate cancer in his maternal uncle. No Known Allergies  Review of Systems  Constitutional:  Negative for chills, fever, malaise/fatigue and weight loss.  Eyes:  Negative for blurred vision.  Respiratory:  Positive for cough. Negative for hemoptysis, shortness of breath and wheezing.   Cardiovascular:  Negative for chest pain, orthopnea and leg swelling.  Neurological:  Negative for dizziness,  weakness and headaches.      Objective:     BP 122/76 (BP Location: Left Arm, Patient Position: Sitting, Cuff Size: Large)   Pulse 65   Temp 98.3 F (36.8 C) (Oral)   Ht 5' 10 (1.778 [name])   Wt 228 lb (103.4 kg)   SpO2 97%   BMI 32.71 kg/[name]  BP Readings from Last 3 Encounters:  04/05/23 122/76  02/24/23 122/70  09/08/22 122/70   Wt Readings from Last 3 Encounters:  04/05/23 228 lb (103.4 kg)  02/24/23 226 lb (102.5 kg)  09/08/22 220 lb 12.8 oz (100.2 kg)       Physical Exam Vitals reviewed.  Constitutional:      General: He is not in acute distress.    Appearance: He is not ill-appearing.  HENT:     Right Ear: Tympanic membrane normal.     Left Ear: Tympanic membrane normal.     Mouth/Throat:     Mouth: Mucous membranes are moist.     Pharynx: Oropharynx is clear. No oropharyngeal exudate or posterior oropharyngeal erythema.  Cardiovascular:     Rate and Rhythm: Normal rate and regular rhythm.  Pulmonary:     Effort: Pulmonary effort is normal.     Breath sounds: Normal breath sounds. No wheezing or rales.  Neurological:     Mental Status: He is alert.      No results found for any visits on 04/05/23.    The ASCVD Risk score (Arnett DK, et al., 2019) failed to calculate for the following reasons:   The valid total cholesterol range is 130 to 320 mg/dL    Assessment & Plan:   #1 chronic cough.  He has had some intermittent cough really for about 3 months.  Does not have any red flags such as appetite change, weight loss, hemoptysis, dyspnea, or chest pain.  Etiology unclear.  Does have some intermittent reflux and currently on Protonix  40 mg daily.  But no postnasal drip symptoms.  No wheezing.  No ACE inhibitor use. -Given duration of cough obtain PA and lateral chest x-ray -Discussed GERD management.  Recommend avoiding within 2 to 3 hours of bedtime and also elevate head of bed about 4 to 6 inches -Reassess in 2 weeks at time of physical. -Also remind her to avoid mentholated products such as peppermint which she has been using some  #2 intermittent lightheadedness.  Blood pressure today seated left arm 122/80 and standing 118/78.  No orthostatic change.  No reproducible vertigo on exam.  Increase hydration.  #3 hypertension well-controlled on amlodipine , losartan , and metoprolol .  Continue current regimen  #4 history of CAD.  He has hyperlipidemia currently on rosuvastatin .  Due for follow-up labs which she plans to get in  2 weeks at physical. No follow-ups on file.    Wolm Scarlet, MD

## 2023-04-11 ENCOUNTER — Telehealth: Payer: Self-pay

## 2023-04-11 NOTE — Telephone Encounter (Signed)
 Copied from CRM [id number]. Topic: Clinical - Lab/Test Results >> Apr 11, 2023 11:15 AM Artemio Larry wrote: Reason for CRM: Patient request call back to receive results from x-ray from 04/05/23

## 2023-04-12 DIAGNOSIS — H2513 Age-related nuclear cataract, bilateral: Secondary | ICD-10-CM | POA: Diagnosis not present

## 2023-04-13 NOTE — Telephone Encounter (Signed)
Patient informed of the message below.

## 2023-04-14 ENCOUNTER — Other Ambulatory Visit: Payer: Self-pay | Admitting: Family Medicine

## 2023-04-29 ENCOUNTER — Encounter: Payer: Self-pay | Admitting: Family Medicine

## 2023-04-29 ENCOUNTER — Ambulatory Visit (INDEPENDENT_AMBULATORY_CARE_PROVIDER_SITE_OTHER): Payer: Medicare HMO | Admitting: Family Medicine

## 2023-04-29 VITALS — BP 138/70 | HR 66 | Temp 97.8°F | Ht 69.29 in | Wt 225.8 lb

## 2023-04-29 DIAGNOSIS — I2510 Atherosclerotic heart disease of native coronary artery without angina pectoris: Secondary | ICD-10-CM

## 2023-04-29 DIAGNOSIS — Z125 Encounter for screening for malignant neoplasm of prostate: Secondary | ICD-10-CM | POA: Diagnosis not present

## 2023-04-29 DIAGNOSIS — Z0000 Encounter for general adult medical examination without abnormal findings: Secondary | ICD-10-CM | POA: Diagnosis not present

## 2023-04-29 DIAGNOSIS — R7303 Prediabetes: Secondary | ICD-10-CM

## 2023-04-29 DIAGNOSIS — G8929 Other chronic pain: Secondary | ICD-10-CM

## 2023-04-29 DIAGNOSIS — M25571 Pain in right ankle and joints of right foot: Secondary | ICD-10-CM

## 2023-04-29 LAB — LIPID PANEL
Cholesterol: 91 mg/dL (ref 0–200)
HDL: 30.2 mg/dL — ABNORMAL LOW (ref >39.00)
LDL Cholesterol: 36 mg/dL (ref 0–99)
NonHDL: 60.46
Total CHOL/HDL Ratio: 3
Triglycerides: 122 mg/dL (ref 0.0–149.0)
VLDL: 24.4 mg/dL (ref 0.0–40.0)

## 2023-04-29 LAB — BASIC METABOLIC PANEL
BUN: 11 mg/dL (ref 6–23)
CO2: 29 meq/L (ref 19–32)
Calcium: 9.6 mg/dL (ref 8.4–10.5)
Chloride: 103 meq/L (ref 96–112)
Creatinine, Ser: 1.13 mg/dL (ref 0.40–1.50)
GFR: 67.01 mL/min (ref >60.00)
Glucose, Bld: 108 mg/dL — ABNORMAL HIGH (ref 70–99)
Potassium: 4.4 meq/L (ref 3.5–5.1)
Sodium: 139 meq/L (ref 135–145)

## 2023-04-29 LAB — CBC WITH DIFFERENTIAL/PLATELET
Basophils Absolute: 0.1 10*3/uL (ref 0.0–0.1)
Basophils Relative: 1.4 % (ref 0.0–3.0)
Eosinophils Absolute: 0.2 10*3/uL (ref 0.0–0.7)
Eosinophils Relative: 3 % (ref 0.0–5.0)
HCT: 48.9 % (ref 39.0–52.0)
Hemoglobin: 16.2 g/dL (ref 13.0–17.0)
Lymphocytes Relative: 26.5 % (ref 12.0–46.0)
Lymphs Abs: 1.8 10*3/uL (ref 0.7–4.0)
MCHC: 33.1 g/dL (ref 30.0–36.0)
MCV: 93.3 fL (ref 78.0–100.0)
Monocytes Absolute: 0.8 10*3/uL (ref 0.1–1.0)
Monocytes Relative: 11.7 % (ref 3.0–12.0)
Neutro Abs: 3.8 10*3/uL (ref 1.4–7.7)
Neutrophils Relative %: 57.4 % (ref 43.0–77.0)
Platelets: 318 10*3/uL (ref 150.0–400.0)
RBC: 5.24 Mil/uL (ref 4.22–5.81)
RDW: 12.9 % (ref 11.5–15.5)
WBC: 6.6 10*3/uL (ref 4.0–10.5)

## 2023-04-29 LAB — PSA, MEDICARE: PSA: 1.14 ng/mL (ref 0.10–4.00)

## 2023-04-29 LAB — HEPATIC FUNCTION PANEL
ALT: 26 U/L (ref 0–53)
AST: 22 U/L (ref 0–37)
Albumin: 4.5 g/dL (ref 3.5–5.2)
Alkaline Phosphatase: 69 U/L (ref 39–117)
Bilirubin, Direct: 0.2 mg/dL (ref 0.0–0.3)
Total Bilirubin: 0.6 mg/dL (ref 0.2–1.2)
Total Protein: 7.4 g/dL (ref 6.0–8.3)

## 2023-04-29 LAB — HEMOGLOBIN A1C: Hgb A1c MFr Bld: 6.1 % (ref 4.6–6.5)

## 2023-04-29 NOTE — Progress Notes (Signed)
 Established Patient Office Visit  Subjective   Patient ID: Miguel [name], male    DOB: 03-01-1956  Age: 68 y.o. MRN: [id number]  Chief Complaint  Patient presents with   Annual Exam    HPI   Miguel [name] is here for physical exam.  Miguel [name] has history of hypertension, thoracic aortic aneurysm, high coronary calcium score of over 2000 with borderline obstructive disease mid LAD.  Miguel [name] was having chest pain and underwent left heart cath 08/25/2022 which showed single-vessel obstructive CAD with tandem lesions in the proximal and mid LAD which were treated with stents.  Has done well since then.  No recent chest pains.  Lipids treated with statin with Crestor 10 mg daily.  Blood pressure has been controlled with amlodipine, losartan, and metoprolol.  Miguel [name] also takes Plavix.  Is having problems with Miguel [name] right ankle.  Miguel [name] states Miguel [name] had fracture and cannot remember which bone back in 1978 which required surgery.  Miguel [name] recently has had some instability in the ankle and increasing ankle pain.  Has not seen orthopedic specialist in several years.  Has some pain medially and laterally  Health maintenance reviewed:  Health Maintenance  Topic Date Due   COVID-19 Vaccine (1 - 2024-25 season) Never done   INFLUENZA VACCINE  05/30/2023 (Originally 09/30/2022)   Zoster Vaccines- Shingrix (1 of 2) 07/03/2023 (Originally 04/18/2005)   Medicare Annual Wellness (AWV)  08/26/2023   Fecal DNA (Cologuard)  04/27/2024   DTaP/Tdap/Td (3 - Td or Tdap) 05/06/2025   Pneumonia Vaccine 25+ Years old  Completed   Hepatitis C Screening  Completed   HPV VACCINES  Aged Out   Colonoscopy  Discontinued   -No history of shingles vaccine.  Miguel [name] will consider. -Doing Cologuard screening.  Will be due for repeat next year  Family history-father died in Miguel [name] 33s complications of head injury.  Miguel [name] had hypertension and hyperlipidemia.  Mom died last year age 71.  She had recurrence of lung cancer.  She also had had previous breast cancer as well  as hypertension and hyperlipidemia.  Sister with type 2 diabetes.  Miguel [name] has another sister BRCA2 positive  Social history-married.  Miguel [name] has 106 year old daughter and 75 year old son.  Miguel [name] son graduated from [employer] this spring.  No history of smoking.  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  Past Medical History:  Diagnosis Date   Chicken pox    Family history of breast cancer    GERD (gastroesophageal reflux disease)    Hypertension    Past Surgical History:  Procedure Laterality Date   CORONARY IMAGING/OCT N/A 08/25/2022   Procedure: CORONARY IMAGING/OCT;  Surgeon: Swaziland, Peter M, MD;  Location: [facility] INVASIVE CV LAB;  Service: Cardiovascular;  Laterality: N/A;   CORONARY STENT INTERVENTION N/A 08/25/2022   Procedure: CORONARY STENT INTERVENTION;  Surgeon: Swaziland, Peter M, MD;  Location: [facility] INVASIVE CV LAB;  Service: Cardiovascular;  Laterality: N/A;   LEFT HEART CATH AND CORONARY ANGIOGRAPHY N/A 08/25/2022   Procedure: LEFT HEART CATH AND CORONARY ANGIOGRAPHY;  Surgeon: Swaziland, Peter M, MD;  Location: [facility] INVASIVE CV LAB;  Service: Cardiovascular;  Laterality: N/A;   MANDIBLE SURGERY  1980   nose repair  1970   deviated septum   REPAIR ANKLE LIGAMENT  1975   REPAIR KNEE LIGAMENT  1980    accident   TONSILLECTOMY      reports that Miguel [name] has never smoked. Miguel [name] has never used smokeless tobacco. Miguel [name] reports that Miguel [name] does not drink  alcohol and does not use drugs. family history includes Brain cancer in Miguel [name] maternal aunt; Breast cancer in Miguel [name] cousin, cousin, cousin, cousin, mother, and other family members; Breast cancer (age of onset: 102) in Miguel [name] sister; Diabetes in Miguel [name] sister; Heart attack in Miguel [name] maternal grandfather; Heart disease in Miguel [name] paternal uncle and sister; Hyperlipidemia in Miguel [name] father, mother, and sister; Hypertension in Miguel [name] father and mother; Pancreatic cancer in Miguel [name] maternal great-grandmother; Prostate cancer in Miguel [name] maternal uncle. No Known  Allergies    Review of Systems  Constitutional:  Negative for chills, fever, malaise/fatigue and weight loss.  HENT:  Negative for hearing loss.   Eyes:  Negative for blurred vision and double vision.  Respiratory:  Negative for cough and shortness of breath.   Cardiovascular:  Negative for chest pain, palpitations and leg swelling.  Gastrointestinal:  Negative for abdominal pain, blood in stool, constipation and diarrhea.  Genitourinary:  Negative for dysuria.  Skin:  Negative for rash.  Neurological:  Negative for dizziness, speech change, seizures, loss of consciousness and headaches.  Psychiatric/Behavioral:  Negative for depression.       Objective:     BP 138/70 (BP Location: Left Arm, Patient Position: Sitting, Cuff Size: Large)   Pulse 66   Temp 97.8 F (36.6 C) (Oral)   Ht 5' 9.29" (1.76 m)   Wt 225 lb 12.8 oz (102.4 kg)   SpO2 95%   BMI 33.07 kg/m  BP Readings from Last 3 Encounters:  04/29/23 138/70  04/05/23 122/76  02/24/23 122/70   Wt Readings from Last 3 Encounters:  04/29/23 225 lb 12.8 oz (102.4 kg)  04/05/23 228 lb (103.4 kg)  02/24/23 226 lb (102.5 kg)      Physical Exam Vitals reviewed.  Constitutional:      General: Miguel [name] is not in acute distress.    Appearance: Miguel [name] is well-developed.  HENT:     Head: Normocephalic and atraumatic.     Right Ear: External ear normal.     Left Ear: External ear normal.  Eyes:     Conjunctiva/sclera: Conjunctivae normal.     Pupils: Pupils are equal, round, and reactive to light.  Neck:     Thyroid: No thyromegaly.  Cardiovascular:     Rate and Rhythm: Normal rate and regular rhythm.     Heart sounds: Normal heart sounds. No murmur heard. Pulmonary:     Effort: No respiratory distress.     Breath sounds: No wheezing or rales.  Abdominal:     General: Bowel sounds are normal. There is no distension.     Palpations: Abdomen is soft.     Tenderness: There is no abdominal tenderness. There is no guarding or  rebound.     Comments: Small ventral hernia which is soft and nontender  Musculoskeletal:     Cervical back: Normal range of motion and neck supple.  Lymphadenopathy:     Cervical: No cervical adenopathy.  Skin:    Findings: No rash.  Neurological:     Mental Status: Miguel [name] is alert and oriented to person, place, and time.     Cranial Nerves: No cranial nerve deficit.      No results found for any visits on 04/29/23.    The ASCVD Risk score (Arnett DK, et al., 2019) failed to calculate for the following reasons:   The valid total cholesterol range is 130 to 320 mg/dL    Assessment & Plan:   Problem List Items Addressed This Visit   None Visit  Diagnoses       Physical exam    -  Primary   Relevant Orders   Basic metabolic panel   Lipid panel   CBC with Differential/Platelet   Hepatic function panel     Prediabetes       Relevant Orders   Hemoglobin A1c     Prostate cancer screening       Relevant Orders   PSA, Medicare     Chronic pain of right ankle       Relevant Orders   Ambulatory referral to Sports Medicine     Here for physical exam.  Miguel [name] does have issue of some progressive right ankle pain as above with a remote fracture back in the late 70s requiring surgery.  Sounds like Miguel [name] had avulsion fracture at that time with ligament injury.  Increasing pain and instability.   -Set up sports medicine referral -Discussed Shingrix vaccine and Miguel [name] declines but will consider checking with insurance at some point this year -Check labs as above.  Include A1c with prior history of A1c 6.3% -Encouraged to try to lose some weight -Consider annual flu vaccine  No follow-ups on file.    Evelena Peat, MD

## 2023-05-05 DIAGNOSIS — K219 Gastro-esophageal reflux disease without esophagitis: Secondary | ICD-10-CM | POA: Diagnosis not present

## 2023-05-05 DIAGNOSIS — E669 Obesity, unspecified: Secondary | ICD-10-CM | POA: Diagnosis not present

## 2023-05-05 DIAGNOSIS — G629 Polyneuropathy, unspecified: Secondary | ICD-10-CM | POA: Diagnosis not present

## 2023-05-05 DIAGNOSIS — Z809 Family history of malignant neoplasm, unspecified: Secondary | ICD-10-CM | POA: Diagnosis not present

## 2023-05-05 DIAGNOSIS — Z8249 Family history of ischemic heart disease and other diseases of the circulatory system: Secondary | ICD-10-CM | POA: Diagnosis not present

## 2023-05-05 DIAGNOSIS — E785 Hyperlipidemia, unspecified: Secondary | ICD-10-CM | POA: Diagnosis not present

## 2023-05-05 DIAGNOSIS — I129 Hypertensive chronic kidney disease with stage 1 through stage 4 chronic kidney disease, or unspecified chronic kidney disease: Secondary | ICD-10-CM | POA: Diagnosis not present

## 2023-05-05 DIAGNOSIS — I739 Peripheral vascular disease, unspecified: Secondary | ICD-10-CM | POA: Diagnosis not present

## 2023-05-05 DIAGNOSIS — N182 Chronic kidney disease, stage 2 (mild): Secondary | ICD-10-CM | POA: Diagnosis not present

## 2023-05-05 DIAGNOSIS — M1990 Unspecified osteoarthritis, unspecified site: Secondary | ICD-10-CM | POA: Diagnosis not present

## 2023-05-05 DIAGNOSIS — I2510 Atherosclerotic heart disease of native coronary artery without angina pectoris: Secondary | ICD-10-CM | POA: Diagnosis not present

## 2023-05-05 DIAGNOSIS — Z791 Long term (current) use of non-steroidal anti-inflammatories (NSAID): Secondary | ICD-10-CM | POA: Diagnosis not present

## 2023-05-05 NOTE — Progress Notes (Signed)
 Rubin Payor, PhD, LAT, ATC acting as a scribe for Clementeen Graham, MD.  Miguel [name] is a 68 y.o. male who presents to [facility] at [facility] today for R ankle pain since this summer. He had some type of fx in his R ankle back in the 70's that required surgery. He also notes some instability in his R ankle. He notes ever since he had his coronary cath, he's been experiencing polyarthralgia. Pt locates pain to all over the ankle joint and notes limited AROM.  A home health nurse visited him yesterday did a screening ABI and noted reduced pressure right leg concerning for PAD.  He notes ankle pain but denies significant calf pain with activity.  R ankle swelling: yes Aggravates: anything Treatments tried: naproxen  Pertinent review of systems: No fevers or chills  Relevant historical information: Coronary artery disease.   Exam:  BP 112/82   Pulse 73   Ht 5\' 9"  (1.753 m)   Wt 225 lb (102.1 kg)   SpO2 97%   BMI 33.23 kg/m  General: Well Developed, well nourished, and in no acute distress.   MSK: Right leg mature scar medial ankle no significant Swelling. Ankle is mildly tender palpation medial and lateral joint line. Reduced range of motion. Intact strength. Capillary fill is intact pulses are diminished dorsal pedis and posterior tibialis.    Lab and Radiology Results  Diagnostic Limited MSK Ultrasound of: Right ankle Degenerative changes medial and lateral joint line with mild effusion.  No significant tenosynovitis. Impression: DJD   X-ray images right ankle obtained today personally and independently interpreted. Vere DJD with fragments and osteophytes present medial and lateral.  Significant degenerative changes at syndesmosis.  No acute fractures are visible.   Await formal radiology review   Assessment and Plan: 68 y.o. male with chronic right ankle pain.  Patient had a fracture and surgery 50 years ago.  Dominant findings today are  arthritis.  Plan for conservative management with compression sleeve Voltaren gel and PT.  If not improved enough consider injection on recheck.  Recheck in 1 month.  There is some concern for peripheral arterial disease based on his CAD history and reduced ABI and a home health evaluation yesterday.  Plan for formal ABI vascular ultrasound to evaluate for PAD.   PDMP not reviewed this encounter. Orders Placed This Encounter  Procedures   [place] LIMITED JOINT SPACE STRUCTURES LOW RIGHT(NO LINKED CHARGES)    Reason for Exam (SYMPTOM  OR DIAGNOSIS REQUIRED):   right ankle pain    Preferred imaging location?:   [facility] Ankle Complete Right    Standing Status:   Future    Number of Occurrences:   1    Expiration Date:   05/05/2024    Reason for Exam (SYMPTOM  OR DIAGNOSIS REQUIRED):   right ankle pain    Preferred imaging location?:   [place]   Ambulatory referral to Physical Therapy    Referral Priority:   Routine    Referral Type:   Physical Medicine    Referral Reason:   Specialty Services Required    Requested Specialty:   Physical Therapy    Number of Visits Requested:   1   No orders of the defined types were placed in this encounter.    Discussed warning signs or symptoms. Please see discharge instructions. Patient expresses understanding.   The above documentation has been reviewed and is accurate and complete  Clementeen Graham, M.D.

## 2023-05-06 ENCOUNTER — Ambulatory Visit: Admitting: Family Medicine

## 2023-05-06 ENCOUNTER — Ambulatory Visit

## 2023-05-06 ENCOUNTER — Other Ambulatory Visit: Payer: Self-pay

## 2023-05-06 VITALS — BP 112/82 | HR 73 | Ht 69.0 in | Wt 225.0 lb

## 2023-05-06 DIAGNOSIS — G8929 Other chronic pain: Secondary | ICD-10-CM

## 2023-05-06 DIAGNOSIS — M25571 Pain in right ankle and joints of right foot: Secondary | ICD-10-CM

## 2023-05-06 DIAGNOSIS — M25471 Effusion, right ankle: Secondary | ICD-10-CM

## 2023-05-06 DIAGNOSIS — R6889 Other general symptoms and signs: Secondary | ICD-10-CM | POA: Diagnosis not present

## 2023-05-06 DIAGNOSIS — M19071 Primary osteoarthritis, right ankle and foot: Secondary | ICD-10-CM | POA: Diagnosis not present

## 2023-05-06 NOTE — Patient Instructions (Addendum)
 Thank you for coming in today.   Please use Voltaren gel (Generic Diclofenac Gel) up to 4x daily for pain as needed.  This is available over-the-counter as both the name brand Voltaren gel and the generic diclofenac gel.   Please get an Xray today before you leave   I recommend you obtained a compression sleeve to help with your joint problems. There are many options on the market however I recommend obtaining a Full Ankle Body Helix compression sleeve.  You can find information (including how to appropriate measure yourself for sizing) can be found at www.[employer].ch.  Many of these products are health savings account (HSA) eligible.  You can use the compression sleeve at any time throughout the day but is most important to use while being active as well as for 2 hours post-activity.   It is appropriate to ice following activity with the compression sleeve in place.   [facility] at [facility] [address] [place], [place] [address] Phone: [phone]

## 2023-05-14 ENCOUNTER — Other Ambulatory Visit: Payer: Self-pay | Admitting: Family Medicine

## 2023-05-16 ENCOUNTER — Ambulatory Visit (INDEPENDENT_AMBULATORY_CARE_PROVIDER_SITE_OTHER): Payer: Medicare HMO

## 2023-05-16 VITALS — Ht 69.0 in | Wt 225.0 lb

## 2023-05-16 DIAGNOSIS — M25571 Pain in right ankle and joints of right foot: Secondary | ICD-10-CM | POA: Diagnosis not present

## 2023-05-16 DIAGNOSIS — Z0000 Encounter for general adult medical examination without abnormal findings: Secondary | ICD-10-CM

## 2023-05-16 NOTE — Progress Notes (Signed)
 Subjective:   Miguel [name] is a 68 y.o. who presents for a Medicare Wellness preventive visit.  Visit Complete: Virtual I connected with  Miguel [name] on 05/16/23 by a audio enabled telemedicine application and verified that I am speaking with the correct person using two identifiers.  Patient Location: Home  Provider Location: Home Office  I discussed the limitations of evaluation and management by telemedicine. The patient expressed understanding and agreed to proceed.  Vital Signs: Because this visit was a virtual/telehealth visit, some criteria may be missing or patient reported. Any vitals not documented were not able to be obtained and vitals that have been documented are patient reported.  VideoDeclined- This patient declined Librarian, academic. Therefore the visit was completed with audio only.  Persons Participating in Visit: Patient.  AWV Questionnaire: No: Patient Medicare AWV questionnaire was not completed prior to this visit.  Cardiac Risk Factors include: advanced age (>36men, >84 women);male gender;hypertension     Objective:    Today's Vitals   05/16/23 0819  Weight: 225 lb (102.1 kg)  Height: 5\' 9"  (1.753 m)   Body mass index is 33.23 kg/m.     05/16/2023    8:24 AM 08/27/2021    8:58 AM  Advanced Directives  Does Patient Have a Medical Advance Directive? Yes Yes  Type of Estate agent of [place];Living will Healthcare Power of [place];Living will  Does patient want to make changes to medical advance directive?  No - Patient declined  Copy of Healthcare Power of Attorney in Chart? No - copy requested No - copy requested    Current Medications (verified) Outpatient Encounter Medications as of 05/16/2023  Medication Sig   amLODipine (NORVASC) 5 MG tablet TAKE 1 TABLET (5 MG TOTAL) BY MOUTH DAILY. *APPOINTMENT NEEDED FOR FUTURE REFILLS   aspirin EC 81 MG tablet Take 81 mg by mouth daily.    clopidogrel (PLAVIX) 75 MG tablet Take 1 tablet (75 mg total) by mouth daily with breakfast.   dextromethorphan-guaiFENesin (MUCINEX DM) 30-600 MG 12hr tablet Take 1 tablet by mouth 2 (two) times daily as needed for cough.   losartan (COZAAR) 100 MG tablet TAKE 1 TABLET BY MOUTH EVERY DAY   metoprolol succinate (TOPROL-XL) 25 MG 24 hr tablet TAKE 1 TABLET (25 MG TOTAL) BY MOUTH DAILY.   nitroGLYCERIN (NITROSTAT) 0.4 MG SL tablet Place 0.4 mg under the tongue every 5 (five) minutes as needed for chest pain.   pantoprazole (PROTONIX) 40 MG tablet Take 1 tablet (40 mg total) by mouth daily.   rosuvastatin (CRESTOR) 10 MG tablet Take 1 tablet (10 mg total) by mouth daily.   No facility-administered encounter medications on file as of 05/16/2023.    Allergies (verified) Patient has no known allergies.   History: Past Medical History:  Diagnosis Date   Chicken pox    Family history of breast cancer    GERD (gastroesophageal reflux disease)    Hypertension    Past Surgical History:  Procedure Laterality Date   CORONARY IMAGING/OCT N/A 08/25/2022   Procedure: CORONARY IMAGING/OCT;  Surgeon: Swaziland, Peter M, MD;  Location: [facility] INVASIVE CV LAB;  Service: Cardiovascular;  Laterality: N/A;   CORONARY STENT INTERVENTION N/A 08/25/2022   Procedure: CORONARY STENT INTERVENTION;  Surgeon: Swaziland, Peter M, MD;  Location: [facility] INVASIVE CV LAB;  Service: Cardiovascular;  Laterality: N/A;   LEFT HEART CATH AND CORONARY ANGIOGRAPHY N/A 08/25/2022   Procedure: LEFT HEART CATH AND CORONARY ANGIOGRAPHY;  Surgeon: Swaziland, Peter M,  MD;  Location: [facility] INVASIVE CV LAB;  Service: Cardiovascular;  Laterality: N/A;   MANDIBLE SURGERY  1980   nose repair  1970   deviated septum   REPAIR ANKLE LIGAMENT  1975   REPAIR KNEE LIGAMENT  1980    accident   TONSILLECTOMY     Family History  Problem Relation Age of Onset   Hyperlipidemia Mother    Hypertension Mother    Breast cancer Mother        dx in her 58s; BRCA2+    Hyperlipidemia Father    Hypertension Father    Diabetes Sister        type 2   Hyperlipidemia Sister    Heart disease Sister    Brain cancer Maternal Aunt        brain stem   Prostate cancer Maternal Uncle    Heart disease Paternal Uncle    Heart attack Maternal Grandfather    Breast cancer Sister 72       BRCA2+   Breast cancer Cousin        mat first cousin   Breast cancer Cousin        mat first cousin   Breast cancer Other        MGM's sister   Pancreatic cancer Maternal Great-grandmother    Breast cancer Other        MGMs sister   Breast cancer Cousin        mother's pat first cousin   Breast cancer Cousin        pt's pat second cousin, once removed - BRCA pos   Colon cancer Neg Hx    Stomach cancer Neg Hx    Social History   Socioeconomic History   Marital status: Married    Spouse name: Not on file   Number of children: Not on file   Years of education: Not on file   Highest education level: Master's degree (e.g., MA, MS, MEng, MEd, MSW, MBA)  Occupational History   Not on file  Tobacco Use   Smoking status: Never   Smokeless tobacco: Never  Vaping Use   Vaping status: Never Used  Substance and Sexual Activity   Alcohol use: No   Drug use: No   Sexual activity: Not on file  Other Topics Concern   Not on file  Social History Narrative   Not on file   Social Drivers of Health   Financial Resource Strain: Low Risk  (05/16/2023)   Overall Financial Resource Strain (CARDIA)    Difficulty of Paying Living Expenses: Not hard at all  Food Insecurity: No Food Insecurity (05/16/2023)   Hunger Vital Sign    Worried About Running Out of Food in the Last Year: Never true    Ran Out of Food in the Last Year: Never true  Transportation Needs: No Transportation Needs (05/16/2023)   PRAPARE - Administrator, Civil Service (Medical): No    Lack of Transportation (Non-Medical): No  Physical Activity: Inactive (05/16/2023)   Exercise Vital Sign    Days  of Exercise per Week: 0 days    Minutes of Exercise per Session: 0 min  Stress: No Stress Concern Present (05/16/2023)   [employer] of Occupational Health - Occupational Stress Questionnaire    Feeling of Stress : Not at all  Social Connections: Socially Integrated (05/16/2023)   Social Connection and Isolation Panel [NHANES]    Frequency of Communication with Friends and Family: More than three times a  week    Frequency of Social Gatherings with Friends and Family: More than three times a week    Attends Religious Services: More than 4 times per year    Active Member of [employer] or Organizations: Yes    Attends Engineer, structural: More than 4 times per year    Marital Status: Married    Tobacco Counseling Counseling given: Not Answered    Clinical Intake:  Pre-visit preparation completed: Yes  Pain : No/denies pain     Nutritional Risks: None Diabetes: No  How often do you need to have someone help you when you read instructions, pamphlets, or other written materials from your doctor or pharmacy?: 1 - Never  Interpreter Needed?: No  Information entered by :: Miguel [name]   Activities of Daily Living     05/16/2023    8:24 AM 08/24/2022    9:02 AM  In your present state of health, do you have any difficulty performing the following activities:  Hearing? 0 0  Vision? 0 0  Difficulty concentrating or making decisions? 0 0  Walking or climbing stairs? 0 0  Dressing or bathing? 0 0  Doing errands, shopping? 0 0  Preparing Food and eating ? N N  Using the Toilet? N N  In the past six months, have you accidently leaked urine? N N  Do you have problems with loss of bowel control? N N  Managing your Medications? N N  Managing your Finances? N N  Housekeeping or managing your Housekeeping? N N    Patient Care Team: Kristian Covey, MD as PCP - General (Family Medicine) Little Ishikawa, MD as PCP - Cardiology (Cardiology)  Indicate any  recent Medical Services you may have received from other than [facility] providers in the past year (date may be approximate).     Assessment:   This is a routine wellness examination for Miguel [name].  Hearing/Vision screen Hearing Screening - Comments:: Denies hearing difficulties   Vision Screening - Comments:: Wears rx glasses - up to date with routine eye exams with  [facility]   Goals Addressed               This Visit's Progress     Increase physical activity (pt-stated)         Depression Screen     05/16/2023    8:23 AM 04/05/2023   10:22 AM 08/26/2022    2:11 PM 04/20/2022    8:04 AM 08/27/2021    8:55 AM 04/17/2021    8:00 AM 06/04/2019   10:04 AM  PHQ 2/9 Scores  PHQ - 2 Score 0 0 0 0 0 0 0  PHQ- 9 Score 0 2 1        Fall Risk     05/16/2023    8:24 AM 04/05/2023    9:57 AM 08/26/2022    2:11 PM 08/24/2022    9:02 AM 04/20/2022    8:04 AM  Fall Risk   Falls in the past year? 0 0 0 0 0  Number falls in past yr: 0 0 0 0 0  Injury with Fall? 0 0 0 0 0  Risk for fall due to : No Fall Risks No Fall Risks No Fall Risks  No Fall Risks  Follow up Falls prevention discussed;Falls evaluation completed Falls evaluation completed Falls evaluation completed  Falls evaluation completed    MEDICARE RISK AT HOME:  Medicare Risk at Home Any stairs in or  around the home?: Yes If so, are there any without handrails?: No Home free of loose throw rugs in walkways, pet beds, electrical cords, etc?: Yes Adequate lighting in your home to reduce risk of falls?: Yes Life alert?: No Use of a cane, walker or w/c?: No Grab bars in the bathroom?: No Shower chair or bench in shower?: No Elevated toilet seat or a handicapped toilet?: No  TIMED UP AND GO:  Was the test performed?  No  Cognitive Function: 6CIT completed        05/16/2023    8:25 AM 08/27/2021    8:59 AM  6CIT Screen  What Year? 0 points 0 points  What month? 0 points 0 points  What time? 0 points 0 points   Count back from 20 0 points 0 points  Months in reverse 0 points 0 points  Repeat phrase 0 points 0 points  Total Score 0 points 0 points    Immunizations Immunization History  Administered Date(s) Administered   PNEUMOCOCCAL CONJUGATE-20 04/17/2021   Td 03/05/2004   Tdap 05/07/2015    Screening Tests Health Maintenance  Topic Date Due   COVID-19 Vaccine (1 - 2024-25 season) Never done   INFLUENZA VACCINE  05/30/2023 (Originally 09/30/2022)   Zoster Vaccines- Shingrix (1 of 2) 07/03/2023 (Originally 04/18/2005)   Fecal DNA (Cologuard)  04/27/2024   Medicare Annual Wellness (AWV)  05/15/2024   DTaP/Tdap/Td (3 - Td or Tdap) 05/06/2025   Pneumonia Vaccine 33+ Years old  Completed   Hepatitis C Screening  Completed   HPV VACCINES  Aged Out   Colonoscopy  Discontinued    Health Maintenance  Health Maintenance Due  Topic Date Due   COVID-19 Vaccine (1 - 2024-25 season) Never done   Health Maintenance Items Addressed:   Additional Screening:  Vision Screening: Recommended annual ophthalmology exams for early detection of glaucoma and other disorders of the eye.  Dental Screening: Recommended annual dental exams for proper oral hygiene  Community Resource Referral / Chronic Care Management:  CRR required this visit?  No   CCM required this visit?  No     Plan:     I have personally reviewed and noted the following in the patient's chart:   Medical and social history Use of alcohol, tobacco or illicit drugs  Current medications and supplements including opioid prescriptions. Patient is not currently taking opioid prescriptions. Functional ability and status Nutritional status Physical activity Advanced directives List of other physicians Hospitalizations, surgeries, and ER visits in previous 12 months Vitals Screenings to include cognitive, depression, and falls Referrals and appointments  In addition, I have reviewed and discussed with patient certain  preventive protocols, quality metrics, and best practice recommendations. A written personalized care plan for preventive services as well as general preventive health recommendations were provided to patient.     Tillie Rung, [name]   03/31/8655   After Visit Summary: (MyChart) Due to this being a telephonic visit, the after visit summary with patients personalized plan was offered to patient via MyChart   Notes: Nothing significant to report at this time.

## 2023-05-16 NOTE — Patient Instructions (Addendum)
 Miguel [name] , Thank you for taking time to come for your Medicare Wellness Visit. I appreciate your ongoing commitment to your health goals. Please review the following plan we discussed and let me know if I can assist you in the future.   Referrals/Orders/Follow-Ups/Clinician Recommendations:   This is a list of the screening recommended for you and due dates:  Health Maintenance  Topic Date Due   COVID-19 Vaccine (1 - 2024-25 season) Never done   Flu Shot  05/30/2023*   Zoster (Shingles) Vaccine (1 of 2) 07/03/2023*   Cologuard (Stool DNA test)  04/27/2024   Medicare Annual Wellness Visit  05/15/2024   DTaP/Tdap/Td vaccine (3 - Td or Tdap) 05/06/2025   Pneumonia Vaccine  Completed   Hepatitis C Screening  Completed   HPV Vaccine  Aged Out   Colon Cancer Screening  Discontinued  *Topic was postponed. The date shown is not the original due date.    Advanced directives: (Copy Requested) Please bring a copy of your health care power of attorney and living will to the office to be added to your chart at your convenience. You can mail to [facility] [address] [place], [place] [address] or email to [email or web address]  Next Medicare Annual Wellness Visit scheduled for next year: Yes

## 2023-05-17 ENCOUNTER — Ambulatory Visit (HOSPITAL_COMMUNITY)
Admission: RE | Admit: 2023-05-17 | Discharge: 2023-05-17 | Disposition: A | Discharge status: Home / Self Care | Source: Ambulatory Visit | Attending: Internal Medicine | Admitting: Internal Medicine

## 2023-05-17 DIAGNOSIS — G8929 Other chronic pain: Secondary | ICD-10-CM

## 2023-05-17 DIAGNOSIS — M25471 Effusion, right ankle: Secondary | ICD-10-CM | POA: Diagnosis not present

## 2023-05-17 DIAGNOSIS — M25571 Pain in right ankle and joints of right foot: Secondary | ICD-10-CM | POA: Insufficient documentation

## 2023-05-17 LAB — VAS US ABI WITH/WO TBI
Left ABI: 1.09
Right ABI: 1.14

## 2023-05-18 ENCOUNTER — Encounter: Payer: Self-pay | Admitting: Family Medicine

## 2023-05-18 DIAGNOSIS — M25571 Pain in right ankle and joints of right foot: Secondary | ICD-10-CM | POA: Diagnosis not present

## 2023-05-18 NOTE — Progress Notes (Signed)
 Artery blood flow in the lower legs is normal without evidence of significant restricted blood flow to the muscles.  This is great news.

## 2023-05-23 DIAGNOSIS — M25571 Pain in right ankle and joints of right foot: Secondary | ICD-10-CM | POA: Diagnosis not present

## 2023-05-25 ENCOUNTER — Encounter: Payer: Self-pay | Admitting: Family Medicine

## 2023-05-25 DIAGNOSIS — M25571 Pain in right ankle and joints of right foot: Secondary | ICD-10-CM | POA: Diagnosis not present

## 2023-05-25 NOTE — Progress Notes (Signed)
 Right ankle x-ray shows a prior fracture.  There is mild arthritis present in the ankle joint.

## 2023-05-31 DIAGNOSIS — M25571 Pain in right ankle and joints of right foot: Secondary | ICD-10-CM | POA: Diagnosis not present

## 2023-06-02 DIAGNOSIS — M25571 Pain in right ankle and joints of right foot: Secondary | ICD-10-CM | POA: Diagnosis not present

## 2023-06-06 ENCOUNTER — Ambulatory Visit: Admitting: Family Medicine

## 2023-06-06 ENCOUNTER — Other Ambulatory Visit: Payer: Self-pay

## 2023-06-06 VITALS — BP 116/80 | HR 68 | Ht 69.0 in | Wt 224.0 lb

## 2023-06-06 DIAGNOSIS — M25571 Pain in right ankle and joints of right foot: Secondary | ICD-10-CM | POA: Diagnosis not present

## 2023-06-06 DIAGNOSIS — G8929 Other chronic pain: Secondary | ICD-10-CM

## 2023-06-06 NOTE — Progress Notes (Signed)
   Rubin Payor, PhD, LAT, ATC acting as a scribe for Clementeen Graham, MD.  Miguel [name] is a 68 y.o. male who presents to [facility] today for f/u R ankle pain and swelling. Pt was last seen by Dr. Denyse Amass on 05/06/23 and a vascular US was ordered. He was also advised to use a compression sleeve, Voltaren gel, and was referred to [facility].  Today, pt reports R ankle pain is improving, but pain is different, based on the exercises he is doing at PT.  He thinks his symptoms overall are improving.  Dx testing: 05/17/23 Vasc US 05/06/23 R ankle XR  Pertinent review of systems: No fevers or chills  Relevant historical information: Prior right ankle injury and fracture.   Exam:  BP 116/80   Pulse 68   Ht 5\' 9"  (1.753 m)   Wt 224 lb (101.6 kg)   SpO2 98%   BMI 33.08 kg/m  General: Well Developed, well nourished, and in no acute distress.   MSK: R Ankle mild effusion decreased range of motion.    Lab and Radiology Results  EXAM: RIGHT ANKLE - COMPLETE 3+ VIEW   COMPARISON:  None Available.   FINDINGS: Well corticated bony density is noted adjacent to the medial malleolus consistent with prior fracture and nonunion. Similar findings to a lesser degree are noted laterally. No acute fracture or dislocation is noted. Mild degenerative changes of the tibiotalar joint are seen. No soft tissue abnormality is noted.   IMPRESSION: Posttraumatic changes with nonunion.  No acute abnormality noted.     Electronically Signed   By: Alcide Clever M.D.   On: 05/24/2023 22:24 I, Clementeen Graham, personally (independently) visualized and performed the interpretation of the images attached in this note.    Assessment and Plan: 68 y.o. 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  Improvement with PT.  Consider steroid injection in the future if needed check back as needed.   PDMP not reviewed this encounter. No orders of  the defined types were placed in this encounter.  No orders of the defined types were placed in this encounter.    Discussed warning signs or symptoms. Please see discharge instructions. Patient expresses understanding.   The above documentation has been reviewed and is accurate and complete Clementeen Graham, M.D.

## 2023-06-06 NOTE — Patient Instructions (Signed)
 Thank you for coming in today.

## 2023-06-09 DIAGNOSIS — M25571 Pain in right ankle and joints of right foot: Secondary | ICD-10-CM | POA: Diagnosis not present

## 2023-06-14 DIAGNOSIS — M25571 Pain in right ankle and joints of right foot: Secondary | ICD-10-CM | POA: Diagnosis not present

## 2023-06-21 DIAGNOSIS — M25571 Pain in right ankle and joints of right foot: Secondary | ICD-10-CM | POA: Diagnosis not present

## 2023-06-23 DIAGNOSIS — M25571 Pain in right ankle and joints of right foot: Secondary | ICD-10-CM | POA: Diagnosis not present

## 2023-06-28 DIAGNOSIS — M25571 Pain in right ankle and joints of right foot: Secondary | ICD-10-CM | POA: Diagnosis not present

## 2023-06-30 DIAGNOSIS — M25571 Pain in right ankle and joints of right foot: Secondary | ICD-10-CM | POA: Diagnosis not present

## 2023-07-05 DIAGNOSIS — M25571 Pain in right ankle and joints of right foot: Secondary | ICD-10-CM | POA: Diagnosis not present

## 2023-07-07 DIAGNOSIS — M25571 Pain in right ankle and joints of right foot: Secondary | ICD-10-CM | POA: Diagnosis not present

## 2023-07-12 ENCOUNTER — Other Ambulatory Visit: Payer: Self-pay | Admitting: Thoracic Surgery (Cardiothoracic Vascular Surgery)

## 2023-07-12 DIAGNOSIS — I7121 Aneurysm of the ascending aorta, without rupture: Secondary | ICD-10-CM

## 2023-07-14 ENCOUNTER — Other Ambulatory Visit: Payer: Self-pay | Admitting: Family Medicine

## 2023-07-21 ENCOUNTER — Encounter: Payer: Self-pay | Admitting: Cardiology

## 2023-08-13 ENCOUNTER — Other Ambulatory Visit: Payer: Self-pay | Admitting: Cardiology

## 2023-08-16 ENCOUNTER — Ambulatory Visit (HOSPITAL_COMMUNITY)
Admission: RE | Admit: 2023-08-16 | Discharge: 2023-08-16 | Disposition: A | Discharge status: Home / Self Care | Source: Ambulatory Visit | Attending: Thoracic Surgery (Cardiothoracic Vascular Surgery) | Admitting: Thoracic Surgery (Cardiothoracic Vascular Surgery)

## 2023-08-16 DIAGNOSIS — I700 Atherosclerosis of aorta: Secondary | ICD-10-CM | POA: Insufficient documentation

## 2023-08-16 DIAGNOSIS — I7123 Aneurysm of the descending thoracic aorta, without rupture: Secondary | ICD-10-CM | POA: Insufficient documentation

## 2023-08-16 DIAGNOSIS — R911 Solitary pulmonary nodule: Secondary | ICD-10-CM | POA: Insufficient documentation

## 2023-08-16 DIAGNOSIS — R918 Other nonspecific abnormal finding of lung field: Secondary | ICD-10-CM | POA: Diagnosis not present

## 2023-08-16 DIAGNOSIS — I2510 Atherosclerotic heart disease of native coronary artery without angina pectoris: Secondary | ICD-10-CM | POA: Insufficient documentation

## 2023-08-16 DIAGNOSIS — I7121 Aneurysm of the ascending aorta, without rupture: Secondary | ICD-10-CM

## 2023-08-16 MED ORDER — IOHEXOL 350 MG/ML SOLN
75.0000 mL | Freq: Once | INTRAVENOUS | Status: AC | PRN
Start: 2023-08-16 — End: 2023-08-16
  Administered 2023-08-16: 75 mL via INTRAVENOUS

## 2023-08-23 ENCOUNTER — Ambulatory Visit: Admitting: Thoracic Surgery (Cardiothoracic Vascular Surgery)

## 2023-09-06 ENCOUNTER — Ambulatory Visit
Attending: Thoracic Surgery (Cardiothoracic Vascular Surgery) | Admitting: Thoracic Surgery (Cardiothoracic Vascular Surgery)

## 2023-09-06 ENCOUNTER — Encounter: Payer: Self-pay | Admitting: Thoracic Surgery (Cardiothoracic Vascular Surgery)

## 2023-09-06 VITALS — BP 142/78 | HR 74 | Resp 20 | Ht 69.0 in | Wt 230.6 lb

## 2023-09-06 DIAGNOSIS — R918 Other nonspecific abnormal finding of lung field: Secondary | ICD-10-CM

## 2023-09-06 DIAGNOSIS — I7121 Aneurysm of the ascending aorta, without rupture: Secondary | ICD-10-CM
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
## 2023-10-28 ENCOUNTER — Ambulatory Visit: Attending: Cardiology | Admitting: Cardiology

## 2023-10-28 ENCOUNTER — Encounter: Payer: Self-pay | Admitting: Cardiology

## 2023-10-28 VITALS — BP 110/60 | HR 62 | Ht 70.0 in | Wt 225.0 lb

## 2023-10-28 DIAGNOSIS — I10 Essential (primary) hypertension: Secondary | ICD-10-CM | POA: Diagnosis not present

## 2023-10-28 DIAGNOSIS — I2510 Atherosclerotic heart disease of native coronary artery without angina pectoris: Secondary | ICD-10-CM | POA: Diagnosis not present

## 2023-10-28 DIAGNOSIS — E785 Hyperlipidemia, unspecified: Secondary | ICD-10-CM

## 2023-10-28 DIAGNOSIS — I719 Aortic aneurysm of unspecified site, without rupture: Secondary | ICD-10-CM

## 2023-10-28 NOTE — Patient Instructions (Signed)
 Medication Instructions:  STOP Plavix   *If you need a refill on your cardiac medications before your next appointment, please call your pharmacy*  Lab Work: NONE If you have labs (blood work) drawn today and your tests are completely normal, you will receive your results only by: MyChart Message (if you have MyChart) OR A paper copy in the mail If you have any lab test that is abnormal or we need to change your treatment, we will call you to review the results.  Testing/Procedures: NONE  Follow-Up: At [facility], you and your health needs are our priority.  As part of our continuing mission to provide you with exceptional heart care, our providers are all part of one team.  This team includes your primary Cardiologist (physician) and Advanced Practice Providers or APPs (Physician Assistants and Nurse Practitioners) who all work together to provide you with the care you need, when you need it.  Your next appointment:   6 Months  Provider:   Lonni LITTIE Nanas, MD    We recommend signing up for the patient portal called MyChart.  Sign up information is provided on this After Visit Summary.  MyChart is used to connect with patients for Virtual Visits (Telemedicine).  Patients are able to view lab/test results, encounter notes, upcoming appointments, etc.  Non-urgent messages can be sent to your provider as well.   To learn more about what you can do with MyChart, go to [email or web address].
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
## 2023-11-09 ENCOUNTER — Other Ambulatory Visit: Payer: Self-pay | Admitting: Family Medicine

## 2023-11-15 IMAGING — CT CT CHEST W/ CM
2 of 5 series · 14 of 36 positions shown, 17 images · IV contrast (agent unspecified)
Comparison: Chest radiograph dated 06/15/2021.

CLINICAL DATA: Concern for aortic aneurysm.

EXAM:
CT CHEST WITH CONTRAST
TECHNIQUE: Multidetector CT imaging of the chest was performed during
intravenous contrast administration.

[Series 4: chest 2.00 br40 s3 · coronal · 0.64mm/px · 3 of 175 slices shown]
[im 35/175  lung]
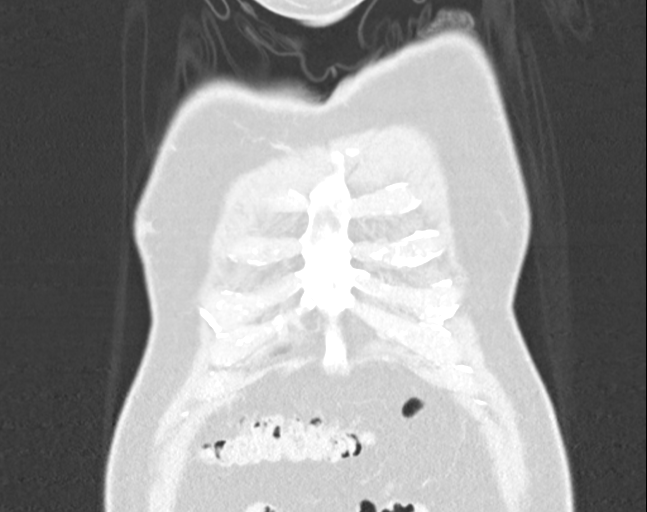
[im 70/175  lung]
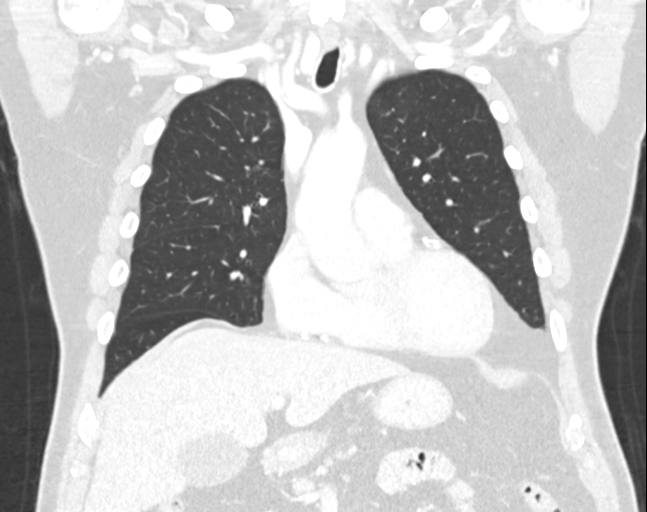
[im 105/175  lung]
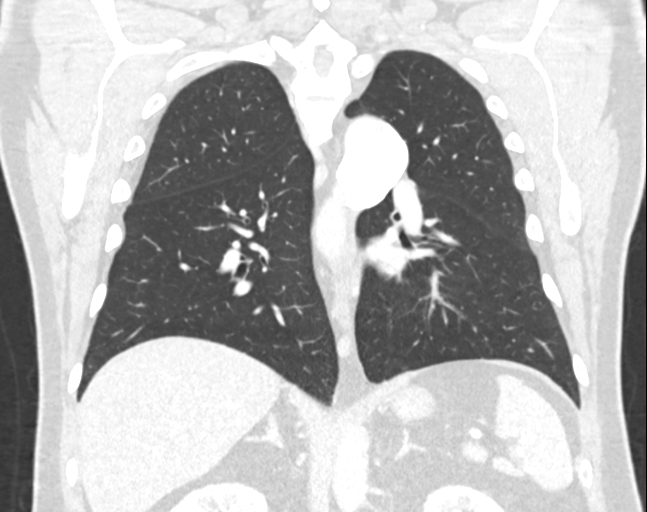

[Series 10: chest 1.00 br40 s3 · axial · 0.77mm/px · z∈[+1502,+1784]mm · 11 of 409 slices shown, 14 images]
[im 28/409  mediastinal]
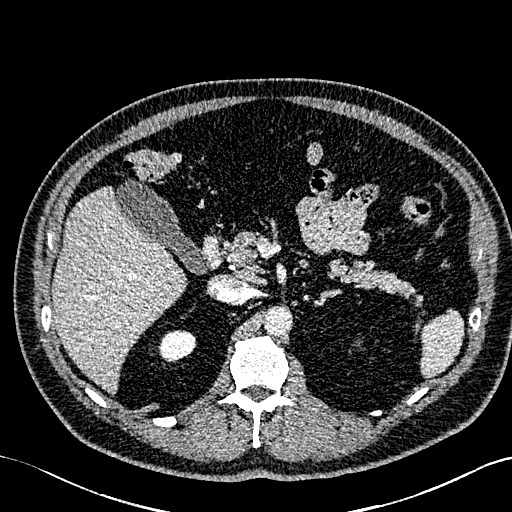
[im 28/409  lung]
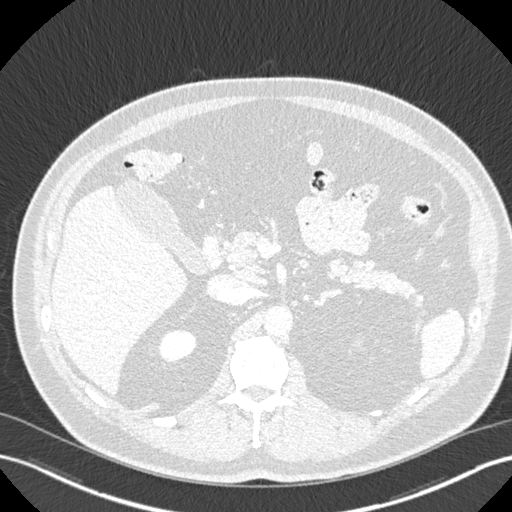
[im 55/409  lung]
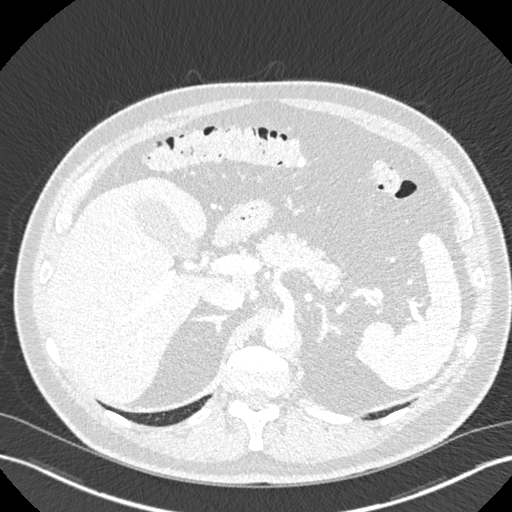
[im 109/409  lung]
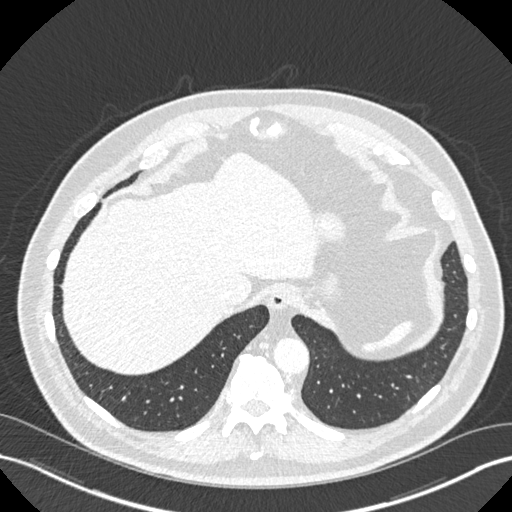
[im 137/409  lung]
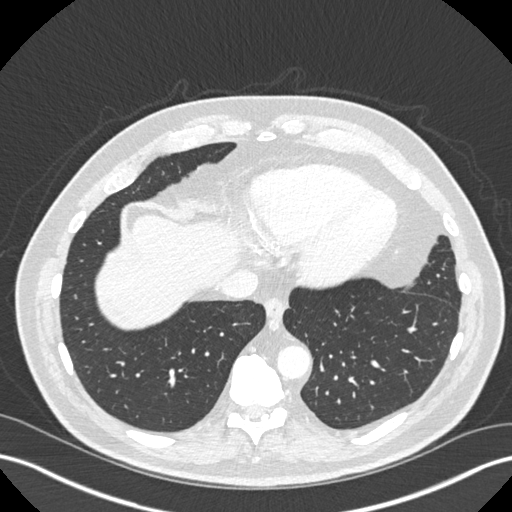
[im 164/409  mediastinal]
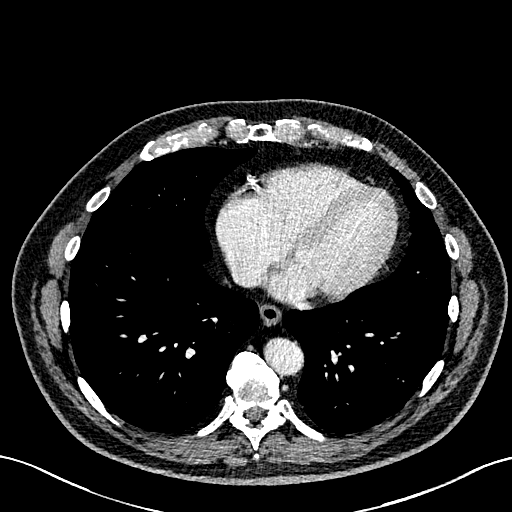
[im 164/409  lung]
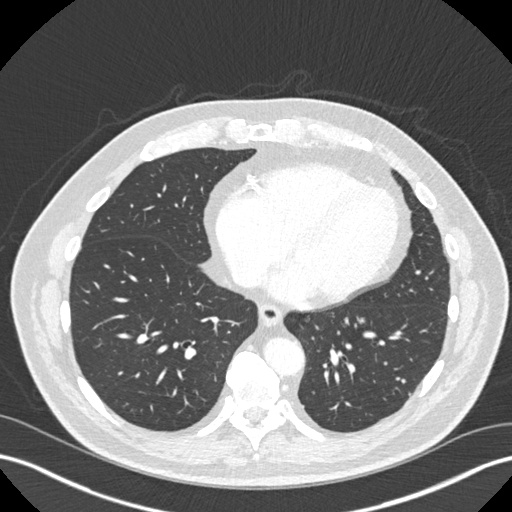
[im 218/409  lung]
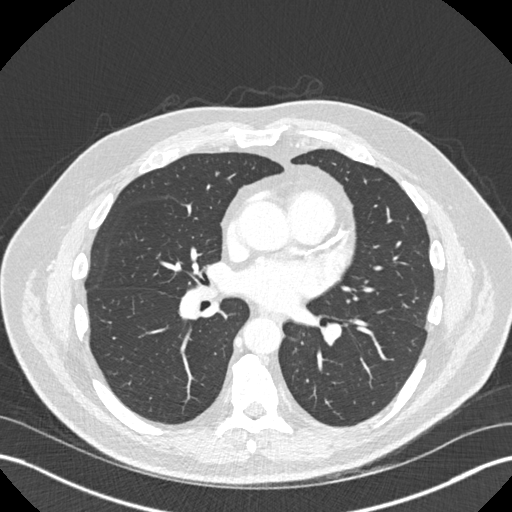
[im 245/409  lung]
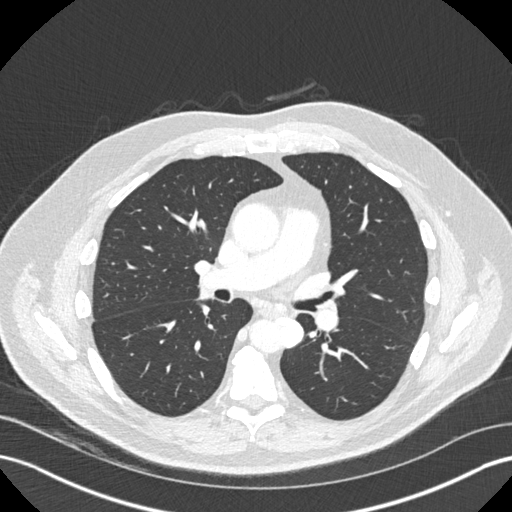
[im 273/409  lung]
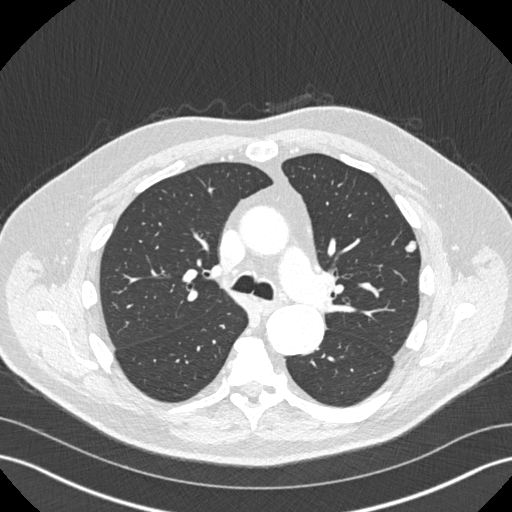
[im 300/409  mediastinal]
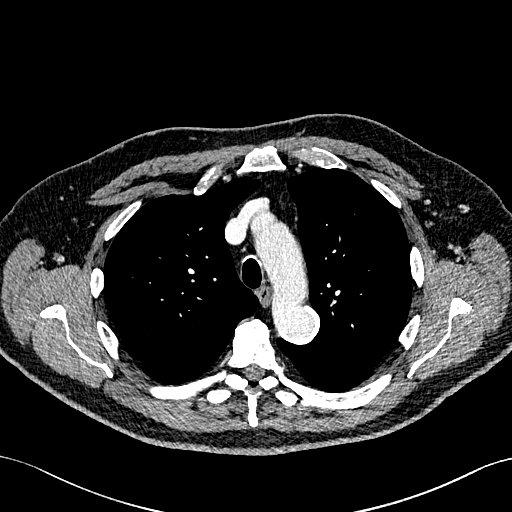
[im 300/409  lung]
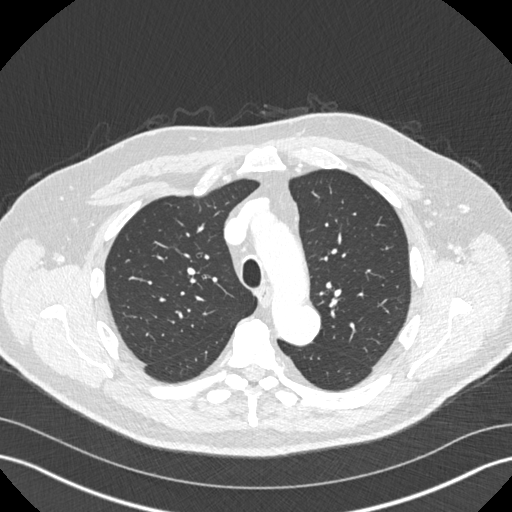
[im 354/409  lung]
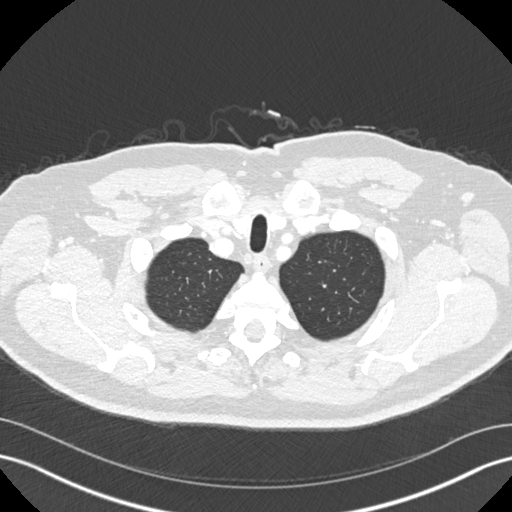
[im 381/409  lung]
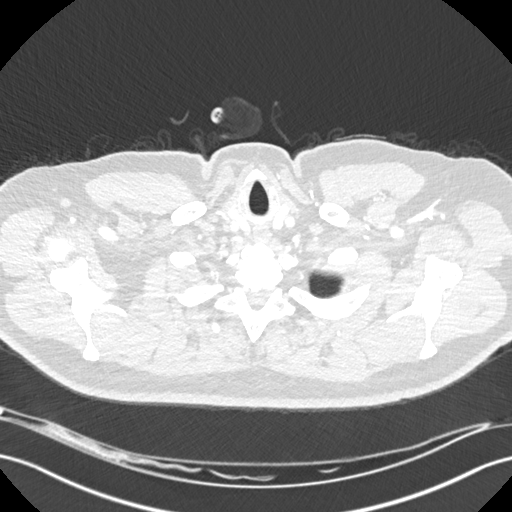

[14 of 36 positions shown; findings below may reference images not displayed]

RADIATION DOSE REDUCTION: This exam was performed according to the
departmental dose-optimization program which includes automated
exposure control, adjustment of the mA and/or kV according to
patient size and/or use of iterative reconstruction technique.

CONTRAST:  75mL 6NO6R0-PMM IOPAMIDOL (6NO6R0-PMM) INJECTION 61%
FINDINGS: Cardiovascular: There is no cardiomegaly or pericardial effusion.
There is 3 vessel coronary vascular calcification. Rim calcified
aneurysm of the aortic isthmus measuring up to 4 cm in greatest
diameter. No aortic dissection. The origins of the great vessels of
the aortic arch appear patent. No pulmonary artery embolus noted.

Mediastinum/Nodes: There is no hilar or mediastinal adenopathy. The
esophagus and the thyroid gland are grossly unremarkable. No
mediastinal fluid collection.

Lungs/Pleura: There is a 9 mm left upper lobe nodule with central
calcification, likely a partially calcified granuloma or a
hamartoma. Several additional pulmonary nodules in the left lower
lobe measure up to 2-3 mm, possibly partially calcified granuloma.
No consolidative changes. There is no pleural effusion or
pneumothorax. The central airways are patent.

Upper Abdomen: Several scattered colonic diverticula.

Musculoskeletal: Mild degenerative changes of the spine. No acute
osseous pathology.
IMPRESSION: 1. No acute intrathoracic pathology.
2. Rim calcified aneurysm of the aortic isthmus measuring up to 4 cm
in greatest diameter. Recommend semi-annual imaging followup by CTA
or MRA and referral to cardiothoracic surgery if not already
obtained. This recommendation follows 9666
ACCF/AHA/AATS/ACR/ASA/SCA/NONAS/MEDO/MOMO/DJAKARIA Guidelines for the
Diagnosis and Management of Patients With Thoracic Aortic Disease.
Circulation. 9666; 121: E266-e36. Aortic aneurysm NOS ([id number].8)
3. Coronary vascular calcification.
4. Several pulmonary nodules measuring up to 9 mm, likely partially
calcified granuloma. Attention on follow-up imaging for the aortic
aneurysm recommended.

## 2023-12-20 ENCOUNTER — Ambulatory Visit

## 2024-01-05 ENCOUNTER — Other Ambulatory Visit: Payer: Self-pay | Admitting: Family Medicine

## 2024-01-31 ENCOUNTER — Other Ambulatory Visit: Payer: Self-pay | Admitting: Cardiology

## 2024-02-03 ENCOUNTER — Encounter: Payer: Self-pay | Admitting: Family Medicine

## 2024-02-03 MED ORDER — ROSUVASTATIN CALCIUM 10 MG PO TABS: 10.0000 mg | ORAL_TABLET | Freq: Every day | ORAL | 1 refills | Status: AC

## 2024-02-06 ENCOUNTER — Other Ambulatory Visit: Payer: Self-pay | Admitting: Cardiology

## 2024-02-08 ENCOUNTER — Encounter: Payer: Self-pay | Admitting: Cardiology

## 2024-02-09 ENCOUNTER — Encounter: Payer: Self-pay | Admitting: Cardiology

## 2024-02-09 MED ORDER — PANTOPRAZOLE SODIUM 40 MG PO TBEC: 40.0000 mg | DELAYED_RELEASE_TABLET | Freq: Every day | ORAL | 3 refills | Status: AC

## 2024-02-09 NOTE — Telephone Encounter (Signed)
 Resolved. See other mychart message.

## 2024-04-30 ENCOUNTER — Encounter: Admitting: Family Medicine

## 2024-05-08 ENCOUNTER — Ambulatory Visit: Admitting: Nurse Practitioner

## 2024-05-21 ENCOUNTER — Ambulatory Visit
